# Patient Record
Sex: Female | Born: 1980 | Race: Black or African American | Hispanic: No | Smoking: Never smoker
Health system: Southern US, Community
[De-identification: ages and names within clinical notes are randomized; demographics above are authoritative.]

## PROBLEM LIST (undated history)

## (undated) DIAGNOSIS — F329 Major depressive disorder, single episode, unspecified: Secondary | ICD-10-CM

## (undated) DIAGNOSIS — O039 Complete or unspecified spontaneous abortion without complication: Secondary | ICD-10-CM

## (undated) DIAGNOSIS — N760 Acute vaginitis: Secondary | ICD-10-CM

## (undated) DIAGNOSIS — R9389 Abnormal findings on diagnostic imaging of other specified body structures: Secondary | ICD-10-CM

## (undated) DIAGNOSIS — Z8619 Personal history of other infectious and parasitic diseases: Secondary | ICD-10-CM

## (undated) DIAGNOSIS — D219 Benign neoplasm of connective and other soft tissue, unspecified: Secondary | ICD-10-CM

## (undated) DIAGNOSIS — F419 Anxiety disorder, unspecified: Secondary | ICD-10-CM

## (undated) DIAGNOSIS — B9689 Other specified bacterial agents as the cause of diseases classified elsewhere: Secondary | ICD-10-CM

## (undated) DIAGNOSIS — F32A Depression, unspecified: Secondary | ICD-10-CM

## (undated) DIAGNOSIS — T7840XA Allergy, unspecified, initial encounter: Secondary | ICD-10-CM

## (undated) DIAGNOSIS — R011 Cardiac murmur, unspecified: Secondary | ICD-10-CM

## (undated) DIAGNOSIS — B977 Papillomavirus as the cause of diseases classified elsewhere: Secondary | ICD-10-CM

## (undated) HISTORY — DX: Depression, unspecified: F32.A

## (undated) HISTORY — DX: Allergy, unspecified, initial encounter: T78.40XA

## (undated) HISTORY — DX: Abnormal findings on diagnostic imaging of other specified body structures: R93.89

## (undated) HISTORY — PX: OTHER SURGICAL HISTORY: SHX169

## (undated) HISTORY — DX: Anxiety disorder, unspecified: F41.9

## (undated) HISTORY — DX: Papillomavirus as the cause of diseases classified elsewhere: B97.7

## (undated) HISTORY — DX: Complete or unspecified spontaneous abortion without complication: O03.9

## (undated) HISTORY — DX: Personal history of other infectious and parasitic diseases: Z86.19

## (undated) HISTORY — DX: Major depressive disorder, single episode, unspecified: F32.9

## (undated) HISTORY — DX: Benign neoplasm of connective and other soft tissue, unspecified: D21.9

---

## 2000-09-13 ENCOUNTER — Emergency Department (HOSPITAL_COMMUNITY): Admission: EM | Admit: 2000-09-13 | Discharge: 2000-09-13 | Payer: Self-pay | Admitting: *Deleted

## 2001-04-07 ENCOUNTER — Emergency Department (HOSPITAL_COMMUNITY): Admission: EM | Admit: 2001-04-07 | Discharge: 2001-04-07 | Payer: Self-pay | Admitting: Emergency Medicine

## 2001-04-08 ENCOUNTER — Emergency Department (HOSPITAL_COMMUNITY): Admission: EM | Admit: 2001-04-08 | Discharge: 2001-04-08 | Payer: Self-pay | Admitting: Internal Medicine

## 2001-07-14 ENCOUNTER — Emergency Department (HOSPITAL_COMMUNITY): Admission: EM | Admit: 2001-07-14 | Discharge: 2001-07-15 | Payer: Self-pay | Admitting: Emergency Medicine

## 2001-09-27 ENCOUNTER — Emergency Department (HOSPITAL_COMMUNITY): Admission: EM | Admit: 2001-09-27 | Discharge: 2001-09-27 | Payer: Self-pay | Admitting: Internal Medicine

## 2001-11-07 ENCOUNTER — Emergency Department (HOSPITAL_COMMUNITY): Admission: EM | Admit: 2001-11-07 | Discharge: 2001-11-07 | Payer: Self-pay | Admitting: Emergency Medicine

## 2001-12-21 ENCOUNTER — Emergency Department (HOSPITAL_COMMUNITY): Admission: EM | Admit: 2001-12-21 | Discharge: 2001-12-21 | Payer: Self-pay | Admitting: Emergency Medicine

## 2002-09-22 ENCOUNTER — Emergency Department (HOSPITAL_COMMUNITY): Admission: EM | Admit: 2002-09-22 | Discharge: 2002-09-23 | Payer: Self-pay | Admitting: Emergency Medicine

## 2002-09-25 ENCOUNTER — Inpatient Hospital Stay (HOSPITAL_COMMUNITY): Admission: AD | Admit: 2002-09-25 | Discharge: 2002-09-25 | Payer: Self-pay | Admitting: Obstetrics and Gynecology

## 2002-09-26 ENCOUNTER — Encounter: Payer: Self-pay | Admitting: *Deleted

## 2002-09-26 ENCOUNTER — Inpatient Hospital Stay (HOSPITAL_COMMUNITY): Admission: AD | Admit: 2002-09-26 | Discharge: 2002-09-26 | Payer: Self-pay | Admitting: *Deleted

## 2002-09-29 ENCOUNTER — Observation Stay (HOSPITAL_COMMUNITY): Admission: AD | Admit: 2002-09-29 | Discharge: 2002-09-30 | Payer: Self-pay | Admitting: Family Medicine

## 2002-10-14 ENCOUNTER — Emergency Department (HOSPITAL_COMMUNITY): Admission: EM | Admit: 2002-10-14 | Discharge: 2002-10-14 | Payer: Self-pay | Admitting: Emergency Medicine

## 2002-10-19 ENCOUNTER — Inpatient Hospital Stay (HOSPITAL_COMMUNITY): Admission: AD | Admit: 2002-10-19 | Discharge: 2002-10-21 | Payer: Self-pay | Admitting: *Deleted

## 2002-11-16 ENCOUNTER — Encounter: Payer: Self-pay | Admitting: *Deleted

## 2002-11-16 ENCOUNTER — Observation Stay (HOSPITAL_COMMUNITY): Admission: AD | Admit: 2002-11-16 | Discharge: 2002-11-17 | Payer: Self-pay | Admitting: *Deleted

## 2002-12-16 ENCOUNTER — Emergency Department (HOSPITAL_COMMUNITY): Admission: EM | Admit: 2002-12-16 | Discharge: 2002-12-16 | Payer: Self-pay | Admitting: *Deleted

## 2002-12-21 ENCOUNTER — Ambulatory Visit (HOSPITAL_COMMUNITY): Admission: RE | Admit: 2002-12-21 | Discharge: 2002-12-21 | Payer: Self-pay | Admitting: *Deleted

## 2002-12-21 ENCOUNTER — Encounter: Payer: Self-pay | Admitting: *Deleted

## 2003-01-18 ENCOUNTER — Ambulatory Visit (HOSPITAL_COMMUNITY): Admission: RE | Admit: 2003-01-18 | Discharge: 2003-01-18 | Payer: Self-pay | Admitting: *Deleted

## 2003-01-18 ENCOUNTER — Encounter: Payer: Self-pay | Admitting: *Deleted

## 2003-02-19 ENCOUNTER — Ambulatory Visit (HOSPITAL_COMMUNITY): Admission: AD | Admit: 2003-02-19 | Discharge: 2003-02-19 | Payer: Self-pay | Admitting: *Deleted

## 2003-03-12 ENCOUNTER — Encounter: Payer: Self-pay | Admitting: *Deleted

## 2003-03-12 ENCOUNTER — Ambulatory Visit (HOSPITAL_COMMUNITY): Admission: RE | Admit: 2003-03-12 | Discharge: 2003-03-12 | Payer: Self-pay | Admitting: *Deleted

## 2003-03-29 ENCOUNTER — Ambulatory Visit (HOSPITAL_COMMUNITY): Admission: AD | Admit: 2003-03-29 | Discharge: 2003-03-29 | Payer: Self-pay | Admitting: *Deleted

## 2003-03-30 ENCOUNTER — Ambulatory Visit (HOSPITAL_COMMUNITY): Admission: AD | Admit: 2003-03-30 | Discharge: 2003-03-30 | Payer: Self-pay | Admitting: *Deleted

## 2003-04-03 ENCOUNTER — Ambulatory Visit (HOSPITAL_COMMUNITY): Admission: AD | Admit: 2003-04-03 | Discharge: 2003-04-03 | Payer: Self-pay | Admitting: *Deleted

## 2003-04-05 ENCOUNTER — Ambulatory Visit (HOSPITAL_COMMUNITY): Admission: AD | Admit: 2003-04-05 | Discharge: 2003-04-05 | Payer: Self-pay | Admitting: *Deleted

## 2003-04-09 ENCOUNTER — Ambulatory Visit (HOSPITAL_COMMUNITY): Admission: RE | Admit: 2003-04-09 | Discharge: 2003-04-09 | Payer: Self-pay | Admitting: *Deleted

## 2003-04-12 ENCOUNTER — Ambulatory Visit (HOSPITAL_COMMUNITY): Admission: RE | Admit: 2003-04-12 | Discharge: 2003-04-12 | Payer: Self-pay | Admitting: *Deleted

## 2003-04-12 ENCOUNTER — Inpatient Hospital Stay (HOSPITAL_COMMUNITY): Admission: AD | Admit: 2003-04-12 | Discharge: 2003-04-15 | Payer: Self-pay | Admitting: *Deleted

## 2003-07-31 ENCOUNTER — Emergency Department (HOSPITAL_COMMUNITY): Admission: EM | Admit: 2003-07-31 | Discharge: 2003-08-01 | Payer: Self-pay | Admitting: Emergency Medicine

## 2003-09-01 ENCOUNTER — Emergency Department (HOSPITAL_COMMUNITY): Admission: EM | Admit: 2003-09-01 | Discharge: 2003-09-01 | Payer: Self-pay | Admitting: Emergency Medicine

## 2003-09-02 ENCOUNTER — Emergency Department (HOSPITAL_COMMUNITY): Admission: EM | Admit: 2003-09-02 | Discharge: 2003-09-02 | Payer: Self-pay | Admitting: Emergency Medicine

## 2004-08-21 ENCOUNTER — Ambulatory Visit (HOSPITAL_COMMUNITY): Admission: RE | Admit: 2004-08-21 | Discharge: 2004-08-21 | Payer: Self-pay | Admitting: Family Medicine

## 2005-07-20 ENCOUNTER — Emergency Department (HOSPITAL_COMMUNITY): Admission: EM | Admit: 2005-07-20 | Discharge: 2005-07-20 | Payer: Self-pay | Admitting: Emergency Medicine

## 2005-10-12 ENCOUNTER — Emergency Department (HOSPITAL_COMMUNITY): Admission: EM | Admit: 2005-10-12 | Discharge: 2005-10-12 | Payer: Self-pay | Admitting: Specialist

## 2005-11-22 ENCOUNTER — Emergency Department (HOSPITAL_COMMUNITY): Admission: EM | Admit: 2005-11-22 | Discharge: 2005-11-22 | Payer: Self-pay | Admitting: Emergency Medicine

## 2005-11-26 ENCOUNTER — Ambulatory Visit (HOSPITAL_COMMUNITY): Admission: RE | Admit: 2005-11-26 | Discharge: 2005-11-26 | Payer: Self-pay | Admitting: Orthopaedic Surgery

## 2006-05-05 ENCOUNTER — Emergency Department (HOSPITAL_COMMUNITY): Admission: EM | Admit: 2006-05-05 | Discharge: 2006-05-05 | Payer: Self-pay | Admitting: Emergency Medicine

## 2006-10-28 ENCOUNTER — Emergency Department (HOSPITAL_COMMUNITY): Admission: EM | Admit: 2006-10-28 | Discharge: 2006-10-28 | Payer: Self-pay | Admitting: Emergency Medicine

## 2007-05-04 ENCOUNTER — Emergency Department (HOSPITAL_COMMUNITY): Admission: EM | Admit: 2007-05-04 | Discharge: 2007-05-04 | Payer: Self-pay | Admitting: Emergency Medicine

## 2007-06-24 ENCOUNTER — Emergency Department (HOSPITAL_COMMUNITY): Admission: EM | Admit: 2007-06-24 | Discharge: 2007-06-24 | Payer: Self-pay | Admitting: Emergency Medicine

## 2007-09-30 ENCOUNTER — Emergency Department (HOSPITAL_COMMUNITY): Admission: EM | Admit: 2007-09-30 | Discharge: 2007-10-01 | Payer: Self-pay | Admitting: Emergency Medicine

## 2007-10-01 ENCOUNTER — Emergency Department (HOSPITAL_COMMUNITY): Admission: EM | Admit: 2007-10-01 | Discharge: 2007-10-01 | Payer: Self-pay | Admitting: Emergency Medicine

## 2009-02-24 ENCOUNTER — Emergency Department (HOSPITAL_COMMUNITY): Admission: EM | Admit: 2009-02-24 | Discharge: 2009-02-24 | Payer: Self-pay | Admitting: Emergency Medicine

## 2009-05-01 ENCOUNTER — Emergency Department (HOSPITAL_COMMUNITY): Admission: EM | Admit: 2009-05-01 | Discharge: 2009-05-01 | Payer: Self-pay | Admitting: Emergency Medicine

## 2010-04-24 ENCOUNTER — Emergency Department (HOSPITAL_COMMUNITY): Admission: EM | Admit: 2010-04-24 | Discharge: 2010-04-24 | Payer: Self-pay | Admitting: Emergency Medicine

## 2010-06-08 ENCOUNTER — Emergency Department (HOSPITAL_COMMUNITY)
Admission: EM | Admit: 2010-06-08 | Discharge: 2010-06-08 | Payer: Self-pay | Source: Home / Self Care | Admitting: Emergency Medicine

## 2010-06-11 ENCOUNTER — Emergency Department (HOSPITAL_COMMUNITY)
Admission: EM | Admit: 2010-06-11 | Discharge: 2010-06-11 | Payer: Self-pay | Source: Home / Self Care | Admitting: Emergency Medicine

## 2010-06-13 ENCOUNTER — Emergency Department (HOSPITAL_COMMUNITY)
Admission: EM | Admit: 2010-06-13 | Discharge: 2010-06-13 | Payer: Self-pay | Source: Home / Self Care | Admitting: Emergency Medicine

## 2010-06-16 LAB — DIFFERENTIAL
Basophils Absolute: 0 10*3/uL (ref 0.0–0.1)
Basophils Absolute: 0 10*3/uL (ref 0.0–0.1)
Basophils Relative: 0 % (ref 0–1)
Basophils Relative: 0 % (ref 0–1)
Eosinophils Absolute: 0.2 10*3/uL (ref 0.0–0.7)
Eosinophils Absolute: 0 10*3/uL (ref 0.0–0.7)
Eosinophils Relative: 2 % (ref 0–5)
Eosinophils Relative: 0 % (ref 0–5)
Lymphocytes Relative: 29 % (ref 12–46)
Lymphocytes Relative: 16 % (ref 12–46)
Lymphs Abs: 2.5 10*3/uL (ref 0.7–4.0)
Lymphs Abs: 1.9 10*3/uL (ref 0.7–4.0)
Monocytes Absolute: 0.6 10*3/uL (ref 0.1–1.0)
Monocytes Absolute: 0.9 10*3/uL (ref 0.1–1.0)
Monocytes Relative: 8 % (ref 3–12)
Monocytes Relative: 8 % (ref 3–12)
Neutro Abs: 5.2 10*3/uL (ref 1.7–7.7)
Neutro Abs: 9.1 10*3/uL — ABNORMAL HIGH (ref 1.7–7.7)
Neutrophils Relative %: 61 % (ref 43–77)
Neutrophils Relative %: 76 % (ref 43–77)

## 2010-06-16 LAB — BASIC METABOLIC PANEL
BUN: 6 mg/dL (ref 6–23)
BUN: 5 mg/dL — ABNORMAL LOW (ref 6–23)
CO2: 25 mEq/L (ref 19–32)
CO2: 25 mEq/L (ref 19–32)
Calcium: 9.3 mg/dL (ref 8.4–10.5)
Calcium: 9.6 mg/dL (ref 8.4–10.5)
Chloride: 101 mEq/L (ref 96–112)
Chloride: 100 mEq/L (ref 96–112)
Creatinine, Ser: 0.66 mg/dL (ref 0.4–1.2)
Creatinine, Ser: 0.73 mg/dL (ref 0.4–1.2)
GFR calc Af Amer: >60 mL/min (ref >60)
GFR calc Af Amer: >60 mL/min (ref >60)
GFR calc non Af Amer: >60 mL/min (ref >60)
GFR calc non Af Amer: >60 mL/min (ref >60)
Glucose, Bld: 89 mg/dL (ref 70–99)
Glucose, Bld: 97 mg/dL (ref 70–99)
Potassium: 4.2 mEq/L (ref 3.5–5.1)
Potassium: 3.4 mEq/L — ABNORMAL LOW (ref 3.5–5.1)
Sodium: 132 mEq/L — ABNORMAL LOW (ref 135–145)
Sodium: 135 mEq/L (ref 135–145)

## 2010-06-16 LAB — URINALYSIS, ROUTINE W REFLEX MICROSCOPIC
Bilirubin Urine: NEGATIVE
Bilirubin Urine: NEGATIVE
Bilirubin Urine: NEGATIVE
Hgb urine dipstick: NEGATIVE
Hgb urine dipstick: NEGATIVE
Hgb urine dipstick: NEGATIVE
Ketones, ur: NEGATIVE mg/dL
Ketones, ur: 15 mg/dL — AB
Ketones, ur: 40 mg/dL — AB
Nitrite: NEGATIVE
Nitrite: NEGATIVE
Nitrite: NEGATIVE
Protein, ur: NEGATIVE mg/dL
Protein, ur: NEGATIVE mg/dL
Protein, ur: NEGATIVE mg/dL
Specific Gravity, Urine: 1.015 (ref 1.005–1.030)
Specific Gravity, Urine: 1.01 (ref 1.005–1.030)
Specific Gravity, Urine: 1.01 (ref 1.005–1.030)
Urine Glucose, Fasting: NEGATIVE mg/dL
Urine Glucose, Fasting: NEGATIVE mg/dL
Urine Glucose, Fasting: NEGATIVE mg/dL
Urobilinogen, UA: 0.2 mg/dL (ref 0.0–1.0)
Urobilinogen, UA: 0.2 mg/dL (ref 0.0–1.0)
Urobilinogen, UA: 0.2 mg/dL (ref 0.0–1.0)
pH: 7.5 (ref 5.0–8.0)
pH: 7 (ref 5.0–8.0)
pH: 7.5 (ref 5.0–8.0)

## 2010-06-16 LAB — CBC
HCT: 33.9 % — ABNORMAL LOW (ref 36.0–46.0)
HCT: 35.7 % — ABNORMAL LOW (ref 36.0–46.0)
Hemoglobin: 11.7 g/dL — ABNORMAL LOW (ref 12.0–15.0)
Hemoglobin: 12.2 g/dL (ref 12.0–15.0)
MCH: 29.9 pg (ref 26.0–34.0)
MCH: 29.7 pg (ref 26.0–34.0)
MCHC: 34.5 g/dL (ref 30.0–36.0)
MCHC: 34.2 g/dL (ref 30.0–36.0)
MCV: 86.7 fL (ref 78.0–100.0)
MCV: 86.9 fL (ref 78.0–100.0)
Platelets: 265 10*3/uL (ref 150–400)
Platelets: 309 10*3/uL (ref 150–400)
RBC: 3.91 MIL/uL (ref 3.87–5.11)
RBC: 4.11 MIL/uL (ref 3.87–5.11)
RDW: 12.1 % (ref 11.5–15.5)
RDW: 12.2 % (ref 11.5–15.5)
WBC: 8.5 10*3/uL (ref 4.0–10.5)
WBC: 11.9 10*3/uL — ABNORMAL HIGH (ref 4.0–10.5)

## 2010-06-16 LAB — COMPREHENSIVE METABOLIC PANEL
ALT: 40 U/L — ABNORMAL HIGH (ref 0–35)
AST: 21 U/L (ref 0–37)
Albumin: 4.1 g/dL (ref 3.5–5.2)
Alkaline Phosphatase: 47 U/L (ref 39–117)
BUN: 5 mg/dL — ABNORMAL LOW (ref 6–23)
CO2: 26 mEq/L (ref 19–32)
Calcium: 9.5 mg/dL (ref 8.4–10.5)
Chloride: 102 mEq/L (ref 96–112)
Creatinine, Ser: 0.76 mg/dL (ref 0.4–1.2)
GFR calc Af Amer: >60 mL/min (ref >60)
GFR calc non Af Amer: >60 mL/min (ref >60)
Glucose, Bld: 94 mg/dL (ref 70–99)
Potassium: 3.9 mEq/L (ref 3.5–5.1)
Sodium: 136 mEq/L (ref 135–145)
Total Bilirubin: 0.6 mg/dL (ref 0.3–1.2)
Total Protein: 7.8 g/dL (ref 6.0–8.3)

## 2010-06-16 LAB — PREGNANCY, URINE: Preg Test, Ur: POSITIVE

## 2010-06-16 LAB — LIPASE, BLOOD: Lipase: 17 U/L (ref 11–59)

## 2010-06-26 ENCOUNTER — Emergency Department (HOSPITAL_COMMUNITY)
Admission: EM | Admit: 2010-06-26 | Discharge: 2010-06-26 | Payer: Self-pay | Source: Home / Self Care | Admitting: Emergency Medicine

## 2010-06-29 ENCOUNTER — Emergency Department (HOSPITAL_COMMUNITY)
Admission: EM | Admit: 2010-06-29 | Discharge: 2010-06-29 | Payer: Self-pay | Source: Home / Self Care | Admitting: Emergency Medicine

## 2010-06-29 LAB — URINALYSIS, ROUTINE W REFLEX MICROSCOPIC
Bilirubin Urine: NEGATIVE
Nitrite: NEGATIVE
Urine Glucose, Fasting: NEGATIVE mg/dL
pH: 6.5 (ref 5.0–8.0)

## 2010-06-29 LAB — COMPREHENSIVE METABOLIC PANEL
ALT: 17 U/L (ref 0–35)
Alkaline Phosphatase: 49 U/L (ref 39–117)
CO2: 26 mEq/L (ref 19–32)
GFR calc non Af Amer: >60 mL/min (ref >60)
Glucose, Bld: 98 mg/dL (ref 70–99)
Potassium: 3.6 mEq/L (ref 3.5–5.1)
Sodium: 134 mEq/L — ABNORMAL LOW (ref 135–145)

## 2010-06-29 LAB — CBC
HCT: 35.5 % — ABNORMAL LOW (ref 36.0–46.0)
Hemoglobin: 12.3 g/dL (ref 12.0–15.0)
WBC: 13.1 10*3/uL — ABNORMAL HIGH (ref 4.0–10.5)

## 2010-06-29 LAB — POCT PREGNANCY, URINE: Preg Test, Ur: POSITIVE

## 2010-06-29 LAB — URINE MICROSCOPIC-ADD ON

## 2010-06-29 LAB — DIFFERENTIAL
Basophils Absolute: 0 10*3/uL (ref 0.0–0.1)
Lymphocytes Relative: 18 % (ref 12–46)
Neutro Abs: 9.7 10*3/uL — ABNORMAL HIGH (ref 1.7–7.7)

## 2010-07-01 ENCOUNTER — Emergency Department (HOSPITAL_COMMUNITY)
Admission: EM | Admit: 2010-07-01 | Discharge: 2010-07-01 | Payer: Self-pay | Source: Home / Self Care | Admitting: Emergency Medicine

## 2010-07-01 LAB — URINE CULTURE: Culture: NO GROWTH

## 2010-07-03 ENCOUNTER — Other Ambulatory Visit (HOSPITAL_COMMUNITY): Payer: Self-pay | Admitting: Family Medicine

## 2010-07-04 ENCOUNTER — Ambulatory Visit (HOSPITAL_COMMUNITY)
Admission: RE | Admit: 2010-07-04 | Discharge: 2010-07-04 | Disposition: A | Discharge status: Home / Self Care | Payer: Medicaid Other | Source: Ambulatory Visit | Attending: Family Medicine | Admitting: Family Medicine

## 2010-07-04 DIAGNOSIS — R110 Nausea: Secondary | ICD-10-CM | POA: Insufficient documentation

## 2010-07-04 DIAGNOSIS — R1310 Dysphagia, unspecified: Secondary | ICD-10-CM | POA: Insufficient documentation

## 2010-07-04 DIAGNOSIS — R634 Abnormal weight loss: Secondary | ICD-10-CM | POA: Insufficient documentation

## 2010-07-05 ENCOUNTER — Inpatient Hospital Stay (INDEPENDENT_AMBULATORY_CARE_PROVIDER_SITE_OTHER)
Admission: RE | Admit: 2010-07-05 | Discharge: 2010-07-05 | Disposition: A | Discharge status: Home / Self Care | Payer: Medicaid Other | Source: Ambulatory Visit | Attending: Family Medicine | Admitting: Family Medicine

## 2010-07-05 DIAGNOSIS — R112 Nausea with vomiting, unspecified: Secondary | ICD-10-CM

## 2010-07-05 LAB — HCG, QUANTITATIVE, PREGNANCY: hCG, Beta Chain, Quant, S: 32319 m[IU]/mL — ABNORMAL HIGH (ref <5)

## 2010-07-07 ENCOUNTER — Other Ambulatory Visit (HOSPITAL_COMMUNITY): Payer: Self-pay | Admitting: Family Medicine

## 2010-07-07 DIAGNOSIS — R102 Pelvic and perineal pain: Secondary | ICD-10-CM

## 2010-07-08 ENCOUNTER — Ambulatory Visit (HOSPITAL_COMMUNITY)
Admission: RE | Admit: 2010-07-08 | Discharge: 2010-07-08 | Disposition: A | Discharge status: Home / Self Care | Payer: Medicaid Other | Source: Ambulatory Visit | Attending: Family Medicine | Admitting: Family Medicine

## 2010-07-08 DIAGNOSIS — N83209 Unspecified ovarian cyst, unspecified side: Secondary | ICD-10-CM | POA: Insufficient documentation

## 2010-07-08 DIAGNOSIS — R102 Pelvic and perineal pain: Secondary | ICD-10-CM

## 2010-07-08 DIAGNOSIS — R9389 Abnormal findings on diagnostic imaging of other specified body structures: Secondary | ICD-10-CM | POA: Insufficient documentation

## 2010-07-08 DIAGNOSIS — Z3200 Encounter for pregnancy test, result unknown: Secondary | ICD-10-CM | POA: Insufficient documentation

## 2010-07-21 ENCOUNTER — Other Ambulatory Visit: Payer: Self-pay | Admitting: Obstetrics & Gynecology

## 2010-07-21 ENCOUNTER — Encounter (HOSPITAL_COMMUNITY): Payer: Medicaid Other | Attending: Obstetrics & Gynecology

## 2010-07-21 DIAGNOSIS — Z01812 Encounter for preprocedural laboratory examination: Secondary | ICD-10-CM | POA: Insufficient documentation

## 2010-07-21 LAB — COMPREHENSIVE METABOLIC PANEL
ALT: 11 U/L (ref 0–35)
AST: 15 U/L (ref 0–37)
Albumin: 3.6 g/dL (ref 3.5–5.2)
Alkaline Phosphatase: 44 U/L (ref 39–117)
Glucose, Bld: 99 mg/dL (ref 70–99)
Potassium: 3.8 mEq/L (ref 3.5–5.1)
Sodium: 137 mEq/L (ref 135–145)
Total Protein: 6.3 g/dL (ref 6.0–8.3)

## 2010-07-21 LAB — TYPE AND SCREEN
ABO/RH(D): B POS
Antibody Screen: NEGATIVE

## 2010-07-21 LAB — CBC
HCT: 32.1 % — ABNORMAL LOW (ref 36.0–46.0)
Hemoglobin: 10.7 g/dL — ABNORMAL LOW (ref 12.0–15.0)
RDW: 12.7 % (ref 11.5–15.5)
WBC: 6.4 10*3/uL (ref 4.0–10.5)

## 2010-07-21 LAB — URINALYSIS, ROUTINE W REFLEX MICROSCOPIC
Bilirubin Urine: NEGATIVE
Ketones, ur: NEGATIVE mg/dL
Urine Glucose, Fasting: NEGATIVE mg/dL

## 2010-07-21 LAB — SURGICAL PCR SCREEN
MRSA, PCR: NEGATIVE
Staphylococcus aureus: NEGATIVE

## 2010-07-23 ENCOUNTER — Ambulatory Visit (HOSPITAL_COMMUNITY)
Admission: RE | Admit: 2010-07-23 | Discharge: 2010-07-23 | Disposition: A | Discharge status: Home / Self Care | Payer: Medicaid Other | Source: Ambulatory Visit | Attending: Obstetrics & Gynecology | Admitting: Obstetrics & Gynecology

## 2010-07-23 ENCOUNTER — Other Ambulatory Visit: Payer: Self-pay | Admitting: Obstetrics & Gynecology

## 2010-07-23 DIAGNOSIS — O0289 Other abnormal products of conception: Secondary | ICD-10-CM | POA: Insufficient documentation

## 2010-08-14 NOTE — Op Note (Signed)
NAME:  Cynthia Molina, Cynthia Molina              ACCOUNT NO.:  [id number]  MEDICAL RECORD NO.:  [id number]           PATIENT TYPE:  O  LOCATION:  DAYP                          FACILITY:  [facility]  PHYSICIAN:  Lazaro Arms, M.D.   DATE OF BIRTH:  17-Apr-1981  DATE OF PROCEDURE: DATE OF DISCHARGE:  07/23/2010                              OPERATIVE REPORT   PREOPERATIVE DIAGNOSES: 1. Persistent elevated quantitative HCG with intrauterine content,     status post termination attempt x2. 2. 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  POSTOPERATIVE DIAGNOSES: 1. Persistent elevated quantitative HCG with intrauterine content,     status post termination attempt x2. 2. 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  PROCEDURE:  Hysteroscopy with suction and sharp uterine curettage.  SURGEON:  Lazaro Arms, M.D.  ANESTHESIA:  General endotracheal.  FINDINGS:  The patient had an unwanted pregnancy which she attempted to terminate on January 18; however, she remained with symptoms of pregnancy.  She sought attention of her local medical doctor in [place], and at that point, had an ultrasound which showed products of conception still remaining in the uterus.  Her quantitative HCG was 32,000 at that time.  She went back to the clinic which did her original termination, and they did another D and C on the patient, that was on February 8.  However, her symptoms of pregnancy persisted including nausea and vomiting and ptyalism, and she presented and saw me last week, at which time I did another ultrasound which revealed contents in the uterus and a quantitative HCG of 15,000 which I thought was most consistent with a probable molar pregnancy.  As a result, she was admitted today for hysteroscopic evaluation and D and C for pathological confirmation.  She called the clinic where she had the termination attempts, and there was no pathology sent on either after either procedure.  Today, there was a minimal amount of tissue in the  anterior wall of the uterus.  The uterus otherwise appeared to be pretty smooth. There was no evidence of myometrial invasion with the tissue, and it was all removed relatively easily.  DESCRIPTION OF OPERATION:  The patient was taken to the operating room, placed in supine position, underwent general endotracheal anesthesia, placed in dorsal supine position, and prepped and draped in usual sterile fashion.  Her cervix was grasped and dilated serially to allow passage of the hysteroscope.  A diagnostic hysteroscopy was performed with the above-noted findings seen.  Suction curettage was then performed with a #12 curved suction curette.  Multiple passes were made, and tissue was removed.  Good uterine cry was obtained in all areas, this was confirmed with the sharp curette.  Methergine 0.2 IM was given for myometrial contraction, and she was observed and the bleeding significantly diminished along with manual uterine massage.  All the tissue was felt to be removed.  The procedure was completed.  The patient was awakened from anesthesia, taken to recovery room in good stable condition.  She received Ancef and Toradol prophylactically and will be seen back in the office next week for followup.     Lazaro Arms, M.D.     Loraine Maple  D:  07/23/2010  T:  07/24/2010  Job:  [id number]  Electronically Signed by Duane Lope M.D. on 08/14/2010 07:43:15 AM

## 2010-09-02 LAB — URINE MICROSCOPIC-ADD ON

## 2010-09-02 LAB — WET PREP, GENITAL: Clue Cells Wet Prep HPF POC: NONE SEEN

## 2010-09-02 LAB — URINALYSIS, ROUTINE W REFLEX MICROSCOPIC
Glucose, UA: NEGATIVE mg/dL
Hgb urine dipstick: NEGATIVE
Ketones, ur: NEGATIVE mg/dL
Protein, ur: NEGATIVE mg/dL
pH: 6.5 (ref 5.0–8.0)

## 2010-09-02 LAB — POCT PREGNANCY, URINE: Preg Test, Ur: NEGATIVE

## 2010-09-05 LAB — GC/CHLAMYDIA PROBE AMP, GENITAL
Chlamydia, DNA Probe: NEGATIVE
GC Probe Amp, Genital: NEGATIVE

## 2010-09-05 LAB — URINALYSIS, ROUTINE W REFLEX MICROSCOPIC
Nitrite: NEGATIVE
Specific Gravity, Urine: 1.031 — ABNORMAL HIGH (ref 1.005–1.030)
Urobilinogen, UA: 4 mg/dL — ABNORMAL HIGH (ref 0.0–1.0)
pH: 6.5 (ref 5.0–8.0)

## 2010-09-05 LAB — URINE MICROSCOPIC-ADD ON

## 2010-09-05 LAB — WET PREP, GENITAL: Yeast Wet Prep HPF POC: NONE SEEN

## 2010-10-17 NOTE — Discharge Summary (Signed)
NAME:  Cynthia Molina, Cynthia Molina                        ACCOUNT NO.:  [id number]   MEDICAL RECORD NO.:  [id number]                   PATIENT TYPE:  OIB   LOCATION:  A415                                 FACILITY:  [facility]   PHYSICIAN:  Langley Gauss, M.D.                DATE OF BIRTH:  02/04/1981   DATE OF ADMISSION:  04/12/2003  DATE OF DISCHARGE:  04/12/2003                                 DISCHARGE SUMMARY   A 30 year old, gravida 1, para 0, 34-3/[redacted] weeks gestation presents to the  [facility] complaining of uterine contractions, lower abdominal  pain since 22:00 the p.m. prior. The patient's prenatal course was otherwise  uncomplicated.   PAST MEDICAL HISTORY:  Noncontributory.   PHYSICAL EXAMINATION:  GENERAL: In no acute distress.  VITAL SIGNS: 97.5, 87, 20, 102/59. External fetal monitor, fetal heart rate  130 to 140, average to long-term variability. ABDOMEN: Only one palpable  uterine contraction is identified and none are traced on external fetal  monitor.  Cervix, per the nursing staff, noted to be fingertip, very thick,  and posterior.  Head is -1 station with the presenting part nonpalpable.   HOSPITAL COURSE:  The patient continued to complications of some pelvic-type  pressure. Thus, she was treated with p.o. Ambien for therapeutic rest.  Observation was continued. The patient was noted to have no renewed  complaints of contraction-type activity nor were there any contractions  noted on external fetal monitor. Fetal heart rate remained reassuring. Thus,  the patient was discharged to home on April 12, 2003, at 03:20.     ___________________________________________                                         Langley Gauss, M.D.   DC/MEDQ  D:  07/02/2003  T:  07/02/2003  Job:  [id number]

## 2010-10-17 NOTE — Discharge Summary (Signed)
NAME:  Cynthia Molina, Cynthia Molina                        ACCOUNT NO.:  [id number]   MEDICAL RECORD NO.:  [id number]                   PATIENT TYPE:  INP   LOCATION:  A417                                 FACILITY:  [facility]   PHYSICIAN:  Langley Gauss, M.D.                DATE OF BIRTH:  [birth year]   DATE OF ADMISSION:  04/12/2003  DATE OF DISCHARGE:  04/15/2003                                 DISCHARGE SUMMARY   DIAGNOSES:  A 34-5/7 weeks intrauterine pregnancy, delivered via a primary  low-transverse cesarean section, delivered a 4 pound 13 ounce female infant.   DISPOSITION:  At the time of discharge patient does have staples in place,  Pfannenstiel incision.  Follow up in the office in 3-4 days time for staple  removal.  She is continued on hotel/motel status, on April 15, 2003, due  to low birth weight and need for continued fetal assessment.   DISCHARGE MEDICATIONS:  1. Tylox, #30, with no refill.  2. The patient is to continue her prenatal vitamins for supplemental iron.   She is given a copy of standarized discharge instructions.   LABORATORY DATA:  Admission hemoglobin/hematocrit 10.0/30.0 with a white  count of 9.9.  On postoperative day #1, hemoglobin 9.6, hematocrit 28.3 with  a white count of 12.8.  B positive blood type.  Negative antibody screen.   HOSPITAL COURSE:  See previous dictations.  The patient was seen in the  office on April 12, 2003, determined to be in active preterm labor,  primary low-transverse cesarean section performed the p.m. of April 12, 2003.  Postoperatively the patient did well.  A Foley catheter documents  excellent urine output, thus it was discontinued on April 13, 2003.  Patient's vital signs remained stable.  With infiltration of the IV, it  likewise too was discontinued and patient did well with p.o. pain  medication.  The patient remained afebrile.  She did increase her ambulation  on April 13, 2003.   On April 14, 2003, she  is again noted to be afebrile.  She is advanced to  a regular general diet.  Abdomen is soft.  JP drain is aspirated and  removed.  The incision is noted to be well approximated, minimal erythema,  minimal induration noted.  The patient thus stable for a conversion to a  __________ hotel status on April 15, 2003 with the baby continuing to be  hospitalized.  The infant's hospital course initially the patient's baby did  require treatment under the Oxyhood, did very well with this and was easily  weaned on postoperative day #1 and has subsequently done well.     ___________________________________________                                         Langley Gauss, M.D.  DC/MEDQ  D:  04/14/2003  T:  04/14/2003  Job:  [id number]

## 2010-10-17 NOTE — Discharge Summary (Signed)
NAME:  Cynthia Molina, Cynthia Molina                        ACCOUNT NO.:  [id number]   MEDICAL RECORD NO.:  [id number]                   PATIENT TYPE:  OBV   LOCATION:  A422                                 FACILITY:  [facility]   PHYSICIAN:  Langley Gauss, M.D.                DATE OF BIRTH:  09/03/1980   DATE OF ADMISSION:  11/16/2002  DATE OF DISCHARGE:  11/17/2002                                 DISCHARGE SUMMARY   DIAGNOSES:  Thirteen-week intrauterine pregnancy with hyperemesis as  exhibited by presence of ketonuria as well as weight loss.  No other  electrolyte disturbances were identified.   LABORATORY DATA:  Pertinent laboratory studies revealed the patient has  previously had prenatal profile 1 done in the office which was essentially  within normal limits.  This hospitalization, hemoglobin 10.9, hematocrit  31.6, with a white count of 7.6.  Amylase minimally elevated at 154.  Liver  function tests within normal limits.  Bilirubin within normal limits.  Urinalysis pertinent for positive ketones and a small amount of bilirubin.  Negative nitrites, negative esterase.   RADIOLOGICAL STUDIES:  An ultrasound of the abdomen had revealed multiple  small gallstones within the gallbladder consistent with cholelithiasis, no  gallbladder wall thickening or fluid was identified.  Common duct was felt  to be within normal limits.  Fetal heart tones were documented during this  ultrasound.   HOSPITAL COURSE:  The patient was seen in the office on November 16, 2002, with  significant complaints of nausea and vomiting and continuing to have excess  salivation with spitting into a cup with the presence of ketonuria, the  weight loss, and the inability to tolerate essentially any p.o. intake, the  patient was admitted for observation and hospitalization.  She was treated  with IV fluids, given IM Phenergan for nausea effect.  She was continued on  the IV fluids to which she responded very well with increased  activity.  She  also had increased appetite such that on the day of discharge, the patient  was tolerating a regular general diet with adequate p.o. intake.  The  gallbladder ultrasonographic findings were discussed with the patient.  At  [redacted] weeks gestation, she may very well just still be experiencing nausea and  vomiting as a result of the pregnancy, but these gallstones will be followed  carefully such that if the nausea and vomiting persists further into the  pregnancy, we may possibly entertain these as a contributing cause.  She was  advised to follow up in our office in one week's time at which time we would  evaluate this and see whether we needed to refer her to a general surgeon.  The patient is thus discharged to home on November 17, 2002.  Langley Gauss, M.D.    DC/MEDQ  D:  12/13/2002  T:  12/13/2002  Job:  [id number]

## 2010-10-17 NOTE — Op Note (Signed)
NAME:  EMERY, BINZ                        ACCOUNT NO.:  [id number]   MEDICAL RECORD NO.:  [id number]                   PATIENT TYPE:  INP   LOCATION:  A417                                 FACILITY:  [facility]   PHYSICIAN:  Langley Gauss, M.D.                DATE OF BIRTH:  Mar 12, 1981   DATE OF PROCEDURE:  04/12/2003  DATE OF DISCHARGE:                                 OPERATIVE REPORT   PREOPERATIVE DIAGNOSES:  1. A 34 5/7 weeks intrauterine pregnancy.  2. Preterm labor with premature cervical dilatation.  3. Footling breech presentation.   POSTOPERATIVE DIAGNOSES:  1. A 34 5/7 weeks intrauterine pregnancy.  2. Preterm labor with premature cervical dilatation.  3. Footling breech presentation.   PROCEDURE:  Primary low transverse cesarean section.   SURGEON:  Langley Gauss, M.D.   ESTIMATED BLOOD LOSS:  600 mL   ANALGESIA:  The patient received placement of spinal from CRNA staff.   DRAINS:  Foley catheter to straight drainage. A JP catheter is also placed  in the subcutaneous space.   FINDINGS:  A 4 pound 13 ounce female infant surrounded by a small amount of  clear amniotic fluid. No ruptured membranes prior to the procedure.  Delivered in a double-footling breech presentation.   DESCRIPTION OF PROCEDURE:  The patient had been referred from the office in  late stages of active preterm labor with cervix dilated 4 cm, double-  footling breech presentation confirmed. The patient did go to the fourth  floor where preparations were made for delivery by cesarean section. Vital  signs were stable. The patient had a spinal analgesic administered by the  anesthesia staff without difficulty was then returned to the operating room  table with a slight left lateral tilt. After ensuring adequate surgical  analgesia, a sharp knife was used to incise a Pfannenstiel incision through  the skin dissecting down to the fascial plane utilizing sharp knife and  cauterizing bleeders along  the way. The fracture is then incised in  transverse curvilinear manner while sharply dissecting off the underlying  rectus muscle. The edges of the fascia were then grasped with straight  Kocher clamps and dissected off the underlying rectus muscle in the midline  both superiorly and inferiorly in the avascular plane. The rectus muscle was  then bluntly separated, peritoneal cavity was atraumatically bluntly entered  to the inferior most portion of the incision. The peritoneal incision  extended superiorly and inferiorly, bladder was identified. A bladder blade  is then placed, lower uterine segment is identified and a bladder flap is  created from the vesicouterine fold utilizing sharp dissection. The bladder  flap was bluntly separated off the inferior lower uterine segment, a sharp  knife was then used to score a low transverse uterine incision. An intact  amniotic sac was encountered in the midline, my index finger was used to  bluntly extend the uterine incision bilaterally. Artificial  rupture of  membranes is then performed with findings of clear amniotic fluid. My right  hand then reached into the uterine cavity where it was noted to be in a back  up position in a double-footling breach presentation. The two feet were  identified and they were delivered through the uterine incision without  difficulty while flexing at the hip.  Gentle traction was placed on the  infant's hip combined with fundal pressure which results in delivery to the  level of the infant's neck. The arms are then swept anteriorly across the  chest. The head is then flexed utilizing Mauriceau maneuver. This combined  with gentle traction and fundal pressure actually results in delivery of the  head  through the uterine incision without difficulty. The mouth and nares  were bulb suctioned of clear amniotic fluid. A spontaneous vigorous  breathing cry was noted. Umbilical cord is milked towards the infant. The  cord  is doubly clamped and cut and infant is taken to the pediatrician, Dr.  Vivia Ewing. Arterial cord gas and cord blood are then obtained. Gentle  traction on the umbilical cord results in separation which upon examination  was noted to be a small though intact placenta with associated three-vessel  cord. Intrauterine x-ray showed __________  fragments, excellent uterine  tone is achieved. The uterine incision is closed in two layers of #1 chromic  in a running locked fashion, second layer being an imbricating layer results  in excellent hemostasis.  Tubes and ovaries noted normal in appearance. The  cul-de-sac is free of all clots, uterus returned to the pelvic cavity.  Sponge, needle and instrument counts correct x2.  Irrigation is then  performed in the pelvic cavity removing all clots. The uterus is returned,  peritoneum grasped using Kelly clamps. The peritoneum as well as the  underlying rectus muscle are closed in a single layer of continuous running  #0 Chromic suture. No subfascial bleeders were identified. The fascia is  then closed with a continuous running #1 PDS suture. The subcutaneous space  is again examined. Several small minimally active bleeders are cauterized. A  JP drain is then placed in the subcutaneous space with a separate exit wound  to the left apex of the incision. The JP drain is sutured into place.  Betadine solution is used to cleanse the subcutaneous tissue. Three  horizontal mattress sutures of #1 PDS suture are then placed into the skin  edges which allows eversion and closure of the skin utilizing skin staples.  A total of 30 mL of 0.5% bupivacaine plain was injected along the incision.  The patient is then taken to the recovery room in stable condition at which  time being monitored for return of motor function. Operative findings  discussed with the patient's waiting family. The infant was taken to the nursery at which time some mild retracting and grunting  were noted. The  infant was then placed under the Oxy-Hood. Most recently the infant is noted  to be stable under the Oxy-Hood with stabilization of condition.      ___________________________________________                                            Langley Gauss, M.D.   DC/MEDQ  D:  04/13/2003  T:  04/13/2003  Job:  [id number]

## 2010-10-17 NOTE — H&P (Signed)
   NAME:  Cynthia Molina, Cynthia Molina                        ACCOUNT NO.:  [id number]   MEDICAL RECORD NO.:  [id number]                   PATIENT TYPE:  INP   LOCATION:  A423                                 FACILITY:  [facility]   PHYSICIAN:  Langley Gauss, M.D.                DATE OF BIRTH:  Apr 22, 1981   DATE OF ADMISSION:  10/19/2002  DATE OF DISCHARGE:                                HISTORY & PHYSICAL   HISTORY OF PRESENT ILLNESS:  The patient is a 30 year old at [redacted] weeks  gestation.  She is admitted directly from the office with findings of a 9-  pound weight loss over the prior 2 weeks as well as moderate ketones.  The  patient presented for a routine visit, stating that she had intermittent  nausea and vomiting, but over the last 5 days this has been nearly  consistent.  She did not contact anyone prior to today's routine scheduled  visit.  The patient also has some reflux-type symptoms.  She denies any  fatty food intolerance stating that everything she eats makes her feel  nauseated.  She eats a bland diet as well as liquids, and has difficulty  even keeping ginger ale down.  Thus, she is referred to the hospital for a  direct admission.  The patient's prenatal course otherwise uncomplicated.  She did have an ultrasound performed on 10/19/2002 which documented a viable  intrauterine pregnancy at [redacted] weeks gestation with fetal cardiac activity  identified.  On examination she is noted to be afebrile.  Abdomen soft and  nontender. Moderate ketones noted in the office.   PHYSICAL EXAMINATION:  GENERAL:  The patient minimally verbal.  VITAL SIGNS:  98.2, blood pressure is 108/61; pulse is 94; respiratory rate  is 18.  HEENT:  Mucous membranes slightly moist.  ABDOMEN:  Soft, nontender.  PELVIC:  Normal external genitalia.   LABORATORY DATA:  On Transabdominal ultrasound a viable, vigorous  intrauterine pregnancy with fetal cardiac activity identified with findings  consistent with a [redacted] weeks  gestation.  Laboratory studies:  As stated  previously 2+ ketonuria on dip stick.  Hemoglobin 12.6, hematocrit 35.8 with  a white count of 9.4, metabolic disturbance as exhibited by moderate  ketonuria.  In addition, sodium low at 131, potassium low at 3.2.   ASSESSMENT:  A 9-week intrauterine pregnancy with hyperemesis gravidarum  exhibited by metabolic disturbance with ketonuria and abnormal electrolytes.  The patient is admitted for IV fluids as well as antiemetic therapy.                                               Langley Gauss, M.D.    DC/MEDQ  D:  10/21/2002  T:  10/21/2002  Job:  [id number]

## 2010-10-17 NOTE — H&P (Signed)
NAME:  Cynthia Molina, Cynthia Molina                        ACCOUNT NO.:  [id number]   MEDICAL RECORD NO.:  [id number]                   PATIENT TYPE:  INP   LOCATION:  A417                                 FACILITY:  [facility]   PHYSICIAN:  Langley Gauss, M.D.                DATE OF BIRTH:  25-Aug-1980   DATE OF ADMISSION:  04/12/2003  DATE OF DISCHARGE:                                HISTORY & PHYSICAL   REASON FOR ADMISSION:  This is a 30 year old, gravida 1, para 0, 34-5/[redacted]  weeks gestation who was evaluated in the office complaining of irregular  uterine contractions.  The patient was seen early a.m. previously  complaining of irregular uterine contractions.  No contractions were noted  on external fetal monitor.  The patient received therapeutic rest at that  time.  She states she slept well and awoke this a.m.  No leakage of fluid,  no vaginal bleeding, but she did notice increased primarily pelvic pressure  and some renewed tightening.   In the office, the patient is examined and noted to be 4 cm dilated with a  footling breech presentation identified.  The patient's prenatal course  complicated by decreased amniotic fluid on serial ultrasounds with an AFI  typically around 7.  The patient has been receiving twice weekly NSTs which  have all been reactive.  The most recent ultrasound did, however, reveal  good fetal growth with fetal growth at the 50th percentile.  The patient is  noted to be a B positive blood type, AFP within normal limits.  The patient  is noted a past medical history of a positive Chlamydia in 2002.  She  subsequently had test of cure which was negative and most recently March 12, 2003 GC and Chlamydia cultures negative.  She currently is on prenatal  vitamins.  Last p.m. she did receive Ambien, Lortab, and a single Keflex.   ALLERGIES:  She has no known drug allergies.   PHYSICAL EXAMINATION:  VITAL SIGNS:  Height 5 feet 4.5 inches, 145 pounds  prepregnancy, 146  today, blood pressure 114/60, pulse 80, respiratory rate  20.  HEENT:  Negative.  No adenopathy.  Mucous membranes moist.  NECK:  Supple.  Thyroid is nonpalpable.  LUNGS:  Clear.  CARDIOVASCULAR:  Regular rate and rhythm.  ABDOMEN:  Soft, nontender.  Fundal height is 33 cm.  A vertex presentation  is not present, rather the head is in the left upper quadrant and by  Leopold's maneuver is noted to be a breech presentation.  Fetal heart tones  are noted to be auscultated at 150.  PELVIC:  Normal external genitalia.  No leakage of fluid, no vaginal  bleeding.  No vaginitis identified.  Digital examination performed in the  office does reveal cervix 4 cm dilated, -2 station, 80% effaced, footling  breech presentation confirmed by palpation of a fetal foot through intact  amniotic membranes.  ASSESSMENT/PLAN:  This is a 34-5/7 weeks intrauterine pregnancy with  premature dilatation and preterm labor with a footling breech presentation.  The risks of continuing the pregnancy at this time with potential for  rupture of membranes and a prolapsed cord are far greater than any risks  associated with preterm delivery at 34-5/[redacted] weeks  gestation.  Thus, no tocolytics are administered and preparations will be  made to proceed with the cesarean section delivery this evening.  Risks and  benefits of the surgical procedure are discussed with the patient and she  elects to proceed.     ___________________________________________                                         Langley Gauss, M.D.   DC/MEDQ  D:  04/14/2003  T:  04/14/2003  Job:  [id number]

## 2010-10-17 NOTE — Discharge Summary (Signed)
   NAME:  RAIZY, AUZENNE                        ACCOUNT NO.:  [id number]   MEDICAL RECORD NO.:  [id number]                   PATIENT TYPE:  INP   LOCATION:  A423                                 FACILITY:  [facility]   PHYSICIAN:  Langley Gauss, M.D.                DATE OF BIRTH:  07-10-1980   DATE OF ADMISSION:  10/19/2002  DATE OF DISCHARGE:                                 DISCHARGE SUMMARY   DIAGNOSES:  1. A 9-week intrauterine pregnancy.  2. Hyperemesis.   DISPOSITION:  Follow up in the office in 2 weeks time.  At the time of  discharge the patient is tolerating a regular general diet.  She has  received IM Phenergan during the hospital stay as well as IV Pepcid.  Fluids  have continued at 150 cc/h.  At the time of discharge the patient is  markedly improved with minimal nausea.  She has excellent p.o. intake  according to the nursing staff, increasing diet on 10/20/2002 such that she  had grilled cheese sandwich, soup broth, and was able to very easily  tolerate p.o. intake; such that she is discharged home on 10/21/2002.   COURSE IN HOSPITAL:  The patient was admitted on 10/19/2002.  She received  IV fluids as well as IM Phenergan.  The patient slept well throughout the  first evening as she had had difficulty sleeping.  Hospitalization,  therefore, consisted primarily of correction of the electrolytes with  intravenous fluids and antiemetic therapy.  The patient will be followed  closely in the office for return of the weight that she has lost and  continued weight gain during the pregnancy.                                               Langley Gauss, M.D.    DC/MEDQ  D:  10/21/2002  T:  10/21/2002  Job:  [id number]

## 2011-03-09 LAB — D-DIMER, QUANTITATIVE: D-Dimer, Quant: 0.31

## 2011-03-09 LAB — CBC
HCT: 38.6
MCHC: 33
MCV: 87.1
Platelets: 296
RDW: 12.8

## 2011-03-09 LAB — DIFFERENTIAL
Basophils Absolute: 0
Basophils Relative: 0
Eosinophils Absolute: 0 — ABNORMAL LOW
Eosinophils Relative: 0
Lymphs Abs: 1.9

## 2012-05-23 ENCOUNTER — Emergency Department (HOSPITAL_COMMUNITY)
Admission: EM | Admit: 2012-05-23 | Discharge: 2012-05-24 | Disposition: A | Discharge status: Home / Self Care | Payer: Medicaid Other | Attending: Emergency Medicine | Admitting: Emergency Medicine

## 2012-05-23 ENCOUNTER — Encounter (HOSPITAL_COMMUNITY): Payer: Self-pay | Admitting: *Deleted

## 2012-05-23 DIAGNOSIS — R350 Frequency of micturition: Secondary | ICD-10-CM | POA: Insufficient documentation

## 2012-05-23 DIAGNOSIS — N390 Urinary tract infection, site not specified: Secondary | ICD-10-CM

## 2012-05-23 DIAGNOSIS — R011 Cardiac murmur, unspecified: Secondary | ICD-10-CM | POA: Insufficient documentation

## 2012-05-23 DIAGNOSIS — Z3202 Encounter for pregnancy test, result negative: Secondary | ICD-10-CM | POA: Insufficient documentation

## 2012-05-23 HISTORY — DX: Cardiac murmur, unspecified: R01.1

## 2012-05-23 LAB — WET PREP, GENITAL
Clue Cells Wet Prep HPF POC: NONE SEEN
Trich, Wet Prep: NONE SEEN

## 2012-05-23 LAB — URINALYSIS, ROUTINE W REFLEX MICROSCOPIC
Bilirubin Urine: NEGATIVE
Ketones, ur: NEGATIVE mg/dL
Nitrite: NEGATIVE
Protein, ur: NEGATIVE mg/dL
pH: 5.5 (ref 5.0–8.0)

## 2012-05-23 LAB — URINE MICROSCOPIC-ADD ON

## 2012-05-23 NOTE — ED Notes (Signed)
Pelvic cart at bedside, set up as requested by provider.

## 2012-05-23 NOTE — ED Provider Notes (Signed)
History    This chart was scribed for Cynthia Seamen, MD, MD by Smitty Pluck, ED Scribe. The patient was seen in room APA02 and the patient's care was started at 10:57 PM.   CSN: [id number]  Arrival date & time 05/23/12  2028      Chief Complaint  Patient presents with  . Dysuria    (Consider location/radiation/quality/duration/timing/severity/associated sxs/prior treatment) HPI Cynthia Molina is a 31 y.o. female who presents to the Emergency Department complaining of constant, moderate dysuria onset 2 weeks ago. Pt reports having urinary frequency, foul odor to urine and white vaginal discharge. Symptoms are moderate. Pt has taken AZO without relief. She reports having vaginal itching. She denies fever, chills.nausea, abdominal pain, vomiting and any other pain. Pt reports that she has skin lesion to right buttock that itches.    Past Medical History  Diagnosis Date  . Heart murmur     Past Surgical History  Procedure Date  . Cesarean section     History reviewed. No pertinent family history.  History  Substance Use Topics  . Smoking status: Never Smoker   . Smokeless tobacco: Not on file  . Alcohol Use: No    OB History    Grav Para Term Preterm Abortions TAB SAB Ect Mult Living                  Review of Systems 10 Systems reviewed and all are negative for acute change except as noted in the HPI.   Allergies  Compazine  Home Medications   Current Outpatient Rx  Name  Route  Sig  Dispense  Refill  . PHENAZOPYRIDINE HCL 95 MG PO TABS   Oral   Take 95 mg by mouth once as needed. For relief           BP 119/76  Pulse 56  Resp 20  SpO2 100%  LMP 05/17/2012  Physical Exam  Nursing note and vitals reviewed. Constitutional: She appears well-developed and well-nourished. No distress.  HENT:  Head: Normocephalic and atraumatic.  Eyes: Conjunctivae normal and EOM are normal. Pupils are equal, round, and reactive to light.  Neck: Normal range of  motion. Neck supple.  Cardiovascular: Normal rate, regular rhythm and normal heart sounds.   No murmur heard. Pulmonary/Chest: Effort normal and breath sounds normal. No respiratory distress. She has no wheezes. She has no rales.  Abdominal: Soft. She exhibits no distension. There is no tenderness. There is no rebound, no guarding and no CVA tenderness.  Skin:       There is a raised plaque right of superior aspect of natal cleft that appears to be scratched.   GU: normal external genitalia; blood tinged mucoid discharge; no cervical motion tenderness; no adnexal tenderness  ED Course  Procedures (including critical care time)   COORDINATION OF CARE: 11:01 PM Discussed ED treatment with pt      MDM   Nursing notes and vitals signs, including pulse oximetry, reviewed.  Summary of this visit's results, reviewed by myself:  Labs:  Results for orders placed during the hospital encounter of 05/23/12 (from the past 24 hour(s))  URINALYSIS, ROUTINE W REFLEX MICROSCOPIC     Status: Abnormal   Collection Time   05/23/12 10:05 PM      Component Value Range   Color, Urine YELLOW  YELLOW   APPearance CLEAR  CLEAR   Specific Gravity, Urine >1.030 (*) 1.005 - 1.030   pH 5.5  5.0 - 8.0  Glucose, UA NEGATIVE  NEGATIVE mg/dL   Hgb urine dipstick MODERATE (*) NEGATIVE   Bilirubin Urine NEGATIVE  NEGATIVE   Ketones, ur NEGATIVE  NEGATIVE mg/dL   Protein, ur NEGATIVE  NEGATIVE mg/dL   Urobilinogen, UA 0.2  0.0 - 1.0 mg/dL   Nitrite NEGATIVE  NEGATIVE   Leukocytes, UA TRACE (*) NEGATIVE  PREGNANCY, URINE     Status: Normal   Collection Time   05/23/12 10:05 PM      Component Value Range   Preg Test, Ur NEGATIVE  NEGATIVE  URINE MICROSCOPIC-ADD ON     Status: Abnormal   Collection Time   05/23/12 10:05 PM      Component Value Range   Squamous Epithelial / LPF FEW (*) RARE   WBC, UA 3-6  <3 WBC/hpf   RBC / HPF 11-20  <3 RBC/hpf   Bacteria, UA MANY (*) RARE  WET PREP, GENITAL      Status: Normal   Collection Time   05/23/12 11:35 PM      Component Value Range   Yeast Wet Prep HPF POC NONE SEEN  NONE SEEN   Trich, Wet Prep NONE SEEN  NONE SEEN   Clue Cells Wet Prep HPF POC NONE SEEN  NONE SEEN   WBC, Wet Prep HPF POC NONE SEEN  NONE SEEN   Patient states she is no longer on her menses. Given the number of red blood cells in her urine as well as her symptomatology we'll treat presumptively for urinary tract infection.    I personally performed the services described in this documentation, which was scribed in my presence.  The recorded information has been reviewed and is accurate.      Cynthia Seamen, MD 05/24/12 [phone]

## 2012-05-23 NOTE — ED Notes (Signed)
Dysuria,  Foul odor to urine, vag discharge, and skin lesion to buttock.

## 2012-05-24 MED ORDER — NITROFURANTOIN MONOHYD MACRO 100 MG PO CAPS
100.0000 mg | ORAL_CAPSULE | Freq: Once | ORAL | Status: AC
  Administered 2012-05-24: 100 mg via ORAL
  Filled 2012-05-24: qty 1

## 2012-05-24 MED ORDER — NITROFURANTOIN MONOHYD MACRO 100 MG PO CAPS: 100.0000 mg | ORAL_CAPSULE | Freq: Two times a day (BID) | ORAL | Status: DC

## 2012-05-25 LAB — GC/CHLAMYDIA PROBE AMP
CT Probe RNA: NEGATIVE
GC Probe RNA: NEGATIVE

## 2012-05-26 LAB — URINE CULTURE

## 2012-05-27 NOTE — ED Notes (Signed)
Positive urnc- treated per protocol.  

## 2012-09-01 ENCOUNTER — Emergency Department (HOSPITAL_COMMUNITY)
Admission: EM | Admit: 2012-09-01 | Discharge: 2012-09-01 | Disposition: A | Discharge status: Home / Self Care | Payer: Medicaid Other | Attending: Emergency Medicine | Admitting: Emergency Medicine

## 2012-09-01 ENCOUNTER — Encounter (HOSPITAL_COMMUNITY): Payer: Self-pay | Admitting: *Deleted

## 2012-09-01 DIAGNOSIS — L02419 Cutaneous abscess of limb, unspecified: Secondary | ICD-10-CM | POA: Insufficient documentation

## 2012-09-01 DIAGNOSIS — R21 Rash and other nonspecific skin eruption: Secondary | ICD-10-CM | POA: Insufficient documentation

## 2012-09-01 DIAGNOSIS — R011 Cardiac murmur, unspecified: Secondary | ICD-10-CM | POA: Insufficient documentation

## 2012-09-01 DIAGNOSIS — L0291 Cutaneous abscess, unspecified: Secondary | ICD-10-CM

## 2012-09-01 DIAGNOSIS — L03119 Cellulitis of unspecified part of limb: Secondary | ICD-10-CM | POA: Insufficient documentation

## 2012-09-01 DIAGNOSIS — Z79899 Other long term (current) drug therapy: Secondary | ICD-10-CM | POA: Insufficient documentation

## 2012-09-01 DIAGNOSIS — Z3202 Encounter for pregnancy test, result negative: Secondary | ICD-10-CM | POA: Insufficient documentation

## 2012-09-01 MED ORDER — SULFAMETHOXAZOLE-TRIMETHOPRIM 800-160 MG PO TABS: 1.0000 | ORAL_TABLET | Freq: Two times a day (BID) | ORAL | Status: DC

## 2012-09-01 MED ORDER — HYDROCODONE-ACETAMINOPHEN 5-325 MG PO TABS: 1.0000 | ORAL_TABLET | ORAL | Status: DC | PRN

## 2012-09-01 MED ORDER — IBUPROFEN 600 MG PO TABS: 600.0000 mg | ORAL_TABLET | Freq: Four times a day (QID) | ORAL | Status: DC | PRN

## 2012-09-01 NOTE — ED Notes (Signed)
Pustular raised areas to legs

## 2012-09-02 LAB — POCT PREGNANCY, URINE: Preg Test, Ur: NEGATIVE

## 2012-09-04 ENCOUNTER — Telehealth (HOSPITAL_COMMUNITY): Payer: Self-pay | Admitting: Emergency Medicine

## 2012-09-04 LAB — CULTURE, ROUTINE-ABSCESS

## 2012-09-04 NOTE — ED Provider Notes (Signed)
History     CSN: [id number]  Arrival date & time 09/01/12  1110   First MD Initiated Contact with Patient 09/01/12 1126      Chief Complaint  Patient presents with  . Recurrent Skin Infections    (Consider location/radiation/quality/duration/timing/severity/associated sxs/prior treatment) HPI Comments: Cynthia Molina is a 32 y.o. Female with several raised tender lesions on her legs.  She reports a history of similar episodes in the past and relate her daughter has been treated for mrsa in the past.  She denies fevers and chills and otherwise feels well.  The areas are tender to touch and pain free unless they are palpated.  She has tried warm soaks without relief.  There is no drainage from the wound sites.     The history is provided by the patient.    Past Medical History  Diagnosis Date  . Heart murmur     Past Surgical History  Procedure Laterality Date  . Cesarean section      History reviewed. No pertinent family history.  History  Substance Use Topics  . Smoking status: Never Smoker   . Smokeless tobacco: Not on file  . Alcohol Use: No    OB History   Grav Para Term Preterm Abortions TAB SAB Ect Mult Living                  Review of Systems  Constitutional: Negative for fever and chills.  HENT: Negative for facial swelling.   Respiratory: Negative for shortness of breath and wheezing.   Skin: Positive for rash.  Neurological: Negative for numbness.    Allergies  Compazine  Home Medications   Current Outpatient Rx  Name  Route  Sig  Dispense  Refill  . folic acid (FOLVITE) 1 MG tablet   Oral   Take 1 mg by mouth daily.         Marland Kitchen HYDROcodone-acetaminophen (NORCO/VICODIN) 5-325 MG per tablet   Oral   Take 1 tablet by mouth every 4 (four) hours as needed for pain.   8 tablet   0   . ibuprofen (ADVIL,MOTRIN) 600 MG tablet   Oral   Take 1 tablet (600 mg total) by mouth every 6 (six) hours as needed for pain.   21 tablet   0   .  sulfamethoxazole-trimethoprim (SEPTRA DS) 800-160 MG per tablet   Oral   Take 1 tablet by mouth every 12 (twelve) hours.   20 tablet   0     BP 111/58  Pulse 75  Temp(Src) 98.3 F (36.8 C) (Oral)  Resp 20  Ht 5\' 5"  (1.651 m)  Wt 170 lb (77.111 kg)  BMI 28.29 kg/m2  SpO2 100%  LMP 08/05/2012  Physical Exam  Constitutional: She appears well-developed and well-nourished. No distress.  HENT:  Head: Normocephalic.  Neck: Neck supple.  Cardiovascular: Normal rate.   Pulmonary/Chest: Effort normal. She has no wheezes.  Musculoskeletal: Normal range of motion. She exhibits no edema.  Skin:  Scattered slightly indurated papules on bilateral legs, one on left medial thigh, another on her right lower leg, both are nondraining and non fluctuant.  There is a 0.5 cm raised pustule left lower lateral leg with 1 cm surrounding erythema.  No red streaking.    ED Course  Procedures (including critical care time)   INCISION AND DRAINAGE Performed by: Burgess Amor Consent: Verbal consent obtained. Risks and benefits: risks, benefits and alternatives were discussed Type: abscess  Body area: left lower  leg  Anesthesia: none   The dome of this pustule was opened with a#18 gauge needed,  Multiple punctures made and the dome was completed deroofed,  pateint tolerated well Local anesthetic:na Anesthetic total: none Complexity: simple No blunt dissection to break up loculations indicated  Drainage: purulent  Drainage amount: small  Packing material: none  Patient tolerance: Patient tolerated the procedure well with no immediate complications.     Labs Reviewed  CULTURE, ROUTINE-ABSCESS  POCT PREGNANCY, URINE   No results found.   1. Abscess and cellulitis       MDM  Patient with raised pustule with no deep induration, and several scattered papules, most likely mrsa given family hx.  Prescribed bactrim, also prescribed hydrocodone and ibuprofen for pain.  Encouraged warm  soaks.  Recheck if not improving or if the other lesions progress.          Burgess Amor, PA-C 09/04/12 2107

## 2012-09-04 NOTE — ED Provider Notes (Signed)
Medical screening examination/treatment/procedure(s) were performed by non-physician practitioner and as supervising physician I was immediately available for consultation/collaboration.   Adaleigh Warf, MD 09/04/12 2212 

## 2012-09-05 ENCOUNTER — Telehealth (HOSPITAL_COMMUNITY): Payer: Self-pay | Admitting: Emergency Medicine

## 2012-09-05 NOTE — ED Notes (Signed)
+  Abscess. +MRSA. Patient treated with Septra. Sensitive to same. Per protocol MD.

## 2012-10-14 ENCOUNTER — Other Ambulatory Visit (HOSPITAL_COMMUNITY): Payer: Self-pay | Admitting: Family Medicine

## 2012-10-14 DIAGNOSIS — N63 Unspecified lump in unspecified breast: Secondary | ICD-10-CM

## 2012-10-26 ENCOUNTER — Ambulatory Visit (HOSPITAL_COMMUNITY)
Admission: RE | Admit: 2012-10-26 | Discharge: 2012-10-26 | Disposition: A | Discharge status: Home / Self Care | Payer: Medicaid Other | Source: Ambulatory Visit | Attending: Family Medicine | Admitting: Family Medicine

## 2012-10-26 ENCOUNTER — Other Ambulatory Visit (HOSPITAL_COMMUNITY): Payer: Self-pay | Admitting: Family Medicine

## 2012-10-26 DIAGNOSIS — N63 Unspecified lump in unspecified breast: Secondary | ICD-10-CM

## 2012-11-17 ENCOUNTER — Other Ambulatory Visit (HOSPITAL_COMMUNITY)
Admission: RE | Admit: 2012-11-17 | Discharge: 2012-11-17 | Disposition: A | Discharge status: Home / Self Care | Payer: Medicaid Other | Source: Ambulatory Visit | Attending: Family Medicine | Admitting: Family Medicine

## 2012-11-17 ENCOUNTER — Other Ambulatory Visit: Payer: Self-pay | Admitting: Family Medicine

## 2012-11-17 DIAGNOSIS — R87810 Cervical high risk human papillomavirus (HPV) DNA test positive: Secondary | ICD-10-CM | POA: Insufficient documentation

## 2012-11-17 DIAGNOSIS — Z01419 Encounter for gynecological examination (general) (routine) without abnormal findings: Secondary | ICD-10-CM | POA: Insufficient documentation

## 2012-11-17 DIAGNOSIS — Z1151 Encounter for screening for human papillomavirus (HPV): Secondary | ICD-10-CM | POA: Insufficient documentation

## 2012-12-13 ENCOUNTER — Encounter: Payer: Self-pay | Admitting: *Deleted

## 2012-12-20 ENCOUNTER — Encounter: Payer: Self-pay | Admitting: Obstetrics & Gynecology

## 2012-12-20 ENCOUNTER — Ambulatory Visit (INDEPENDENT_AMBULATORY_CARE_PROVIDER_SITE_OTHER): Payer: Medicaid Other | Admitting: Obstetrics & Gynecology

## 2012-12-20 VITALS — BP 120/80 | Wt 176.0 lb

## 2012-12-20 DIAGNOSIS — R87810 Cervical high risk human papillomavirus (HPV) DNA test positive: Secondary | ICD-10-CM

## 2012-12-20 DIAGNOSIS — R6889 Other general symptoms and signs: Secondary | ICD-10-CM

## 2012-12-20 DIAGNOSIS — IMO0002 Reserved for concepts with insufficient information to code with codable children: Secondary | ICD-10-CM | POA: Insufficient documentation

## 2012-12-20 NOTE — Progress Notes (Signed)
Patient ID: Cynthia Molina, female   DOB: 1980/12/30, 32 y.o.   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  My recommendation and indeed the general recommendation, is there is no clinical benefit to doing tissue examination or colposcopy in the scenario of normal cytology. Normal cytology by definition would have to be the basic building block of normal tissue. I have explained this extensively to patient and she understands and agrees Recommend repeat cytology with reflex HPV in 1 year.  The opposite would also be true.  +HPV with reflex cytology which again with normal cytology would not require further evluation with colposcopic evaluation.

## 2012-12-20 NOTE — Patient Instructions (Signed)
HPV Test The HPV (human papillomavirus) test is used to screen for high-risk types with HPV infection. HPV is a group of about 100 related viruses, of which 40 types are genital viruses. Most HPV viruses cause infections that usually resolve without treatment within 2 years. Some HPV infections can cause skin and genital warts (condylomata). HPV types 16, 18, 31 and 45 are considered high-risk types of HPV. High-risk types of HPV do not usually cause visible warts, but if untreated, may lead to cancers of the outlet of the womb (cervix) or anus. An HPV test identifies the DNA (genetic) strands of the HPV infection. Because the test identifies the DNA strands, the test is also referred to as the HPV DNA test. Although HPV is found in both males and females, the HPV test is only used to screen for cervical cancer in females. This test is recommended for females:  With an abnormal Pap test.  After treatment of an abnormal Pap test.  Aged 30 and older.  After treatment of a high-risk HPV infection. The HPV test may be done at the same time as a Pap test in females over the age of 30. Both the HPV and Pap test require a sample of cells from the cervix. PREPARATION FOR TEST  You may be asked to avoid douching, tampons, or vaginal medicines for 48 hours before the HPV test. You will be asked to urinate before the test. For the HPV test, you will need to lie on an exam table with your feet in stirrups. A spatula will be inserted into the vagina. The spatula will be used to swab the cervix for a cell and mucus sample. The sample will be further evaluated in a lab under a microscope. NORMAL FINDINGS  Normal: High-risk HPV is not found.  Ranges for normal findings may vary among different laboratories and hospitals. You should always check with your doctor after having lab work or other tests done to discuss the meaning of your test results and whether your values are considered within normal limits. MEANING  OF TEST An abnormal HPV test means that high-risk HPV is found. Your caregiver may recommend further testing. Your caregiver will go over the test results with you. He or she will and discuss the importance and meaning of your results, as well as treatment options and the need for additional tests, if necessary. OBTAINING THE RESULTS  It is your responsibility to obtain your test results. Ask the lab or department performing the test when and how you will get your results. Document Released: 06/12/2004 Document Revised: 08/10/2011 Document Reviewed: 02/25/2005 [employer] Patient Information 2014 [employer].  

## 2013-01-19 ENCOUNTER — Ambulatory Visit: Payer: Medicaid Other | Admitting: Obstetrics & Gynecology

## 2013-02-02 ENCOUNTER — Ambulatory Visit (INDEPENDENT_AMBULATORY_CARE_PROVIDER_SITE_OTHER): Payer: Medicaid Other | Admitting: Obstetrics & Gynecology

## 2013-02-02 ENCOUNTER — Encounter: Payer: Self-pay | Admitting: Obstetrics & Gynecology

## 2013-02-02 VITALS — BP 110/70 | Ht 65.0 in | Wt 178.0 lb

## 2013-02-02 DIAGNOSIS — O020 Blighted ovum and nonhydatidiform mole: Secondary | ICD-10-CM | POA: Insufficient documentation

## 2013-02-02 DIAGNOSIS — O0289 Other abnormal products of conception: Secondary | ICD-10-CM

## 2013-02-02 NOTE — Progress Notes (Signed)
Patient ID: Cynthia Molina, female   DOB: [birth year], 32 y.o.   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

## 2013-07-06 ENCOUNTER — Encounter (INDEPENDENT_AMBULATORY_CARE_PROVIDER_SITE_OTHER): Payer: Self-pay

## 2013-07-06 ENCOUNTER — Encounter: Payer: Self-pay | Admitting: Obstetrics & Gynecology

## 2013-07-06 ENCOUNTER — Ambulatory Visit (INDEPENDENT_AMBULATORY_CARE_PROVIDER_SITE_OTHER): Payer: Medicaid Other | Admitting: Obstetrics & Gynecology

## 2013-07-06 VITALS — BP 110/60 | Wt 177.0 lb

## 2013-07-06 DIAGNOSIS — N760 Acute vaginitis: Secondary | ICD-10-CM | POA: Insufficient documentation

## 2013-07-06 MED ORDER — METRONIDAZOLE 500 MG PO TABS: 500.0000 mg | ORAL_TABLET | Freq: Two times a day (BID) | ORAL | Status: DC

## 2013-07-06 NOTE — Progress Notes (Signed)
Patient ID: Cynthia Molina, female   DOB: 1980/07/14, 32 y.o.   MRN: [id number] Cynthia Molina comes in to the office complaining of vaginal pain No pelvic pain Some discharge No itching No burning No dyspareunia Just vaginal pain  Exam Normal external genitalia Vagina is pink and moist with some discharge Wet prep is positive for bacterial vaginosis no yeast no trichomonas No cervical motion tenderness on bimanual Uterus is normal-sized and nontender adnexa is normal size and nontender  Vaginal bacterial vaginosis  Recommend Metronidazole 500 mg twice a day for 7 days I will followup in 2 weeks for reevaluation

## 2013-07-21 ENCOUNTER — Ambulatory Visit: Payer: Medicaid Other | Admitting: Obstetrics & Gynecology

## 2013-07-23 ENCOUNTER — Emergency Department (HOSPITAL_COMMUNITY): Payer: Medicaid Other

## 2013-07-23 ENCOUNTER — Emergency Department (HOSPITAL_COMMUNITY)
Admission: EM | Admit: 2013-07-23 | Discharge: 2013-07-23 | Disposition: A | Discharge status: Home / Self Care | Payer: Medicaid Other | Attending: Emergency Medicine | Admitting: Emergency Medicine

## 2013-07-23 ENCOUNTER — Encounter (HOSPITAL_COMMUNITY): Payer: Self-pay | Admitting: Emergency Medicine

## 2013-07-23 DIAGNOSIS — R011 Cardiac murmur, unspecified: Secondary | ICD-10-CM | POA: Insufficient documentation

## 2013-07-23 DIAGNOSIS — O200 Threatened abortion: Secondary | ICD-10-CM

## 2013-07-23 DIAGNOSIS — Z8619 Personal history of other infectious and parasitic diseases: Secondary | ICD-10-CM | POA: Insufficient documentation

## 2013-07-23 DIAGNOSIS — R109 Unspecified abdominal pain: Secondary | ICD-10-CM | POA: Insufficient documentation

## 2013-07-23 DIAGNOSIS — Z9889 Other specified postprocedural states: Secondary | ICD-10-CM | POA: Insufficient documentation

## 2013-07-23 LAB — CBC WITH DIFFERENTIAL/PLATELET
Basophils Absolute: 0 10*3/uL (ref 0.0–0.1)
Basophils Relative: 0 % (ref 0–1)
EOS PCT: 1 % (ref 0–5)
Eosinophils Absolute: 0.1 10*3/uL (ref 0.0–0.7)
HEMATOCRIT: 36.7 % (ref 36.0–46.0)
Hemoglobin: 12.5 g/dL (ref 12.0–15.0)
LYMPHS PCT: 21 % (ref 12–46)
Lymphs Abs: 2.7 10*3/uL (ref 0.7–4.0)
MCH: 30 pg (ref 26.0–34.0)
MCHC: 34.1 g/dL (ref 30.0–36.0)
MCV: 88.2 fL (ref 78.0–100.0)
MONO ABS: 0.9 10*3/uL (ref 0.1–1.0)
Monocytes Relative: 7 % (ref 3–12)
NEUTROS ABS: 8.8 10*3/uL — AB (ref 1.7–7.7)
Neutrophils Relative %: 70 % (ref 43–77)
Platelets: 350 10*3/uL (ref 150–400)
RBC: 4.16 MIL/uL (ref 3.87–5.11)
RDW: 12.7 % (ref 11.5–15.5)
WBC: 12.5 10*3/uL — ABNORMAL HIGH (ref 4.0–10.5)

## 2013-07-23 LAB — URINALYSIS, ROUTINE W REFLEX MICROSCOPIC
BILIRUBIN URINE: NEGATIVE
GLUCOSE, UA: NEGATIVE mg/dL
HGB URINE DIPSTICK: NEGATIVE
KETONES UR: NEGATIVE mg/dL
LEUKOCYTES UA: NEGATIVE
Nitrite: NEGATIVE
PH: 7 (ref 5.0–8.0)
Protein, ur: NEGATIVE mg/dL
Specific Gravity, Urine: 1.01 (ref 1.005–1.030)
Urobilinogen, UA: 0.2 mg/dL (ref 0.0–1.0)

## 2013-07-23 LAB — POC URINE PREG, ED: Preg Test, Ur: POSITIVE — AB

## 2013-07-23 LAB — WET PREP, GENITAL
Clue Cells Wet Prep HPF POC: NONE SEEN
TRICH WET PREP: NONE SEEN
YEAST WET PREP: NONE SEEN

## 2013-07-23 LAB — HCG, QUANTITATIVE, PREGNANCY: hCG, Beta Chain, Quant, S: 8134 m[IU]/mL — ABNORMAL HIGH (ref <5)

## 2013-07-23 LAB — ABO/RH: ABO/RH(D): B POS

## 2013-07-23 MED ORDER — METOCLOPRAMIDE HCL 10 MG PO TABS: 10.0000 mg | ORAL_TABLET | Freq: Four times a day (QID) | ORAL | Status: DC | PRN

## 2013-07-23 MED ORDER — ONDANSETRON 8 MG PO TBDP: ORAL_TABLET | ORAL | Status: DC

## 2013-07-23 NOTE — ED Provider Notes (Signed)
CSN: [id number]     Arrival date & time 07/23/13  1814 History   First MD Initiated Contact with Patient 07/23/13 1839   This chart was scribed for Babette Relic, MD by Terressa Koyanagi, ED Scribe and Jenne Campus, ED Scribe. This patient was seen in room APA03/APA03 and the patient's care was started at 6:43 PM.   Chief Complaint  Patient presents with  . Abdominal Pain  . Nausea   The history is provided by the patient. No language interpreter was used.   HPI Comments: Cynthia Molina is a 33 y.o. female who presents to the Emergency Department complaining of intermittent nausea that started a few days ago with associated mild, lower abdominal cramping that started today. She denies having any sxs currently. Pt was recently treated for vaginitis with flagyl which improved the symptoms. However, pt complains of recurring scant vaginal discharge over the last few days. Patient denies dysuria, vomiting or diarrhea. Pt has a history of molar pregnancy and G3P1. Pt is a non-smoker and denies alcohol or illegal drug use. She denies any prior abdominal surgeries other than a c-section. She denies any h/o DM.  Past Medical History  Diagnosis Date  . Heart murmur   . Hx of trichomoniasis   . HPV (human papilloma virus) infection    Past Surgical History  Procedure Laterality Date  . Cesarean section    . Elective abortion     History reviewed. No pertinent family history. History  Substance Use Topics  . Smoking status: Never Smoker   . Smokeless tobacco: Never Used  . Alcohol Use: No   OB History   Grav Para Term Preterm Abortions TAB SAB Ect Mult Living   4 1   2  2         Review of Systems  10 Systems reviewed and are negative for acute change except as noted in the HPI.  Allergies  Compazine  Home Medications   Current Outpatient Rx  Name  Route  Sig  Dispense  Refill  . ibuprofen (ADVIL,MOTRIN) 600 MG tablet   Oral   Take 1 tablet (600 mg total) by mouth every 6 (six)  hours as needed for pain.   21 tablet   0   . metoCLOPramide (REGLAN) 10 MG tablet   Oral   Take 1 tablet (10 mg total) by mouth every 6 (six) hours as needed for nausea (nausea/headache).   6 tablet   0   . ondansetron (ZOFRAN ODT) 8 MG disintegrating tablet      8mg  ODT q4 hours prn nausea   4 tablet   0    BP 110/64  Pulse 99  Temp(Src) 98.2 F (36.8 C) (Oral)  Resp 20  Ht 5\' 6"  (1.676 m)  Wt 176 lb (79.833 kg)  BMI 28.42 kg/m2  SpO2 100%  LMP 06/15/2013 Physical Exam  Nursing note and vitals reviewed. Constitutional: She appears well-developed and well-nourished.  Awake, alert, nontoxic appearance.  HENT:  Head: Atraumatic.  Eyes: Right eye exhibits no discharge. Left eye exhibits no discharge.  Neck: Neck supple.  Cardiovascular: Normal rate, regular rhythm and normal heart sounds.   No murmur heard. Pulmonary/Chest: Effort normal and breath sounds normal. She has no rales. She exhibits no tenderness.  Abdominal: Soft. Bowel sounds are normal. She exhibits no mass. There is no tenderness. There is no rebound.  No CVAT   Genitourinary:  Chaperone present for pelvic examination; scant white cervical discharge no bleeding cervix is  closed no cervical motion tenderness no adnexal tenderness internal os closed  Musculoskeletal: She exhibits no tenderness.  Baseline ROM, no obvious new focal weakness.  Neurological:  Mental status and motor strength appears baseline for patient and situation.  Skin: No rash noted.  Psychiatric: She has a normal mood and affect.    ED Course  Procedures (including critical care time) DIAGNOSTIC STUDIES: Oxygen Saturation is 100% on room air, normal by my interpretation.    COORDINATION OF CARE: 6:47 PM-Discussed treatment plan which includes urine test and pregnancy test, but no CT Scan because no current abd pain with pt at bedside and pt agreed to plan.  HCG positive limited bedside US TA apparent small Gsac cannot visualize  yolk sac or fetal pole so formal TV US ordered r/o ectopic. Pt stable in ED with no significant deterioration in condition.Patient / Family informed of clinical course, understand medical decision-making process, and agree with plan.  Labs Review Labs Reviewed  WET PREP, GENITAL - Abnormal; Notable for the following:    WBC, Wet Prep HPF POC FEW (*)    All other components within normal limits  URINALYSIS, ROUTINE W REFLEX MICROSCOPIC - Abnormal; Notable for the following:    APPearance HAZY (*)    All other components within normal limits  CBC WITH DIFFERENTIAL - Abnormal; Notable for the following:    WBC 12.5 (*)    Neutro Abs 8.8 (*)    All other components within normal limits  HCG, QUANTITATIVE, PREGNANCY - Abnormal; Notable for the following:    hCG, Beta Chain, Quant, S 8134 (*)    All other components within normal limits  POC URINE PREG, ED - Abnormal; Notable for the following:    Preg Test, Ur POSITIVE (*)    All other components within normal limits  GC/CHLAMYDIA PROBE AMP  ABO/RH   Imaging Review US Ob Comp Less 14 Wks  07/23/2013   CLINICAL DATA:  Abdominal pain.  Positive beta HCG.  EXAM: OBSTETRIC <14 WK [place] AND TRANSVAGINAL OB US  TECHNIQUE: Both transabdominal and transvaginal ultrasound examinations were performed for complete evaluation of the gestation as well as the maternal uterus, adnexal regions, and pelvic cul-de-sac. Transvaginal technique was performed to assess early pregnancy.  COMPARISON:  None.  FINDINGS: Intrauterine gestational sac: Single.  Yolk sac:  Yes  Embryo:  No  Cardiac Activity: No  MSD:  9.8  mm   5 w   5  d  [place] EDC: 03/20/2014  Maternal uterus/adnexae: No subchorionic hemorrhage. Right ovary is normal. There are 2 cysts in the left ovary, the largest being 3.4 cm.  No visible ectopic pregnancy. Small amount of free fluid in the pelvis.  IMPRESSION: Single intrauterine pregnancy of approximately 5 weeks 5 days gestation. There is no visible embryo  or cardiac activity at this time.   Electronically Signed   By: Rozetta Nunnery M.D.   On: 07/23/2013 21:34   US Ob Transvaginal  07/23/2013   CLINICAL DATA:  Abdominal pain.  Positive beta HCG.  EXAM: OBSTETRIC <14 WK [place] AND TRANSVAGINAL OB US  TECHNIQUE: Both transabdominal and transvaginal ultrasound examinations were performed for complete evaluation of the gestation as well as the maternal uterus, adnexal regions, and pelvic cul-de-sac. Transvaginal technique was performed to assess early pregnancy.  COMPARISON:  None.  FINDINGS: Intrauterine gestational sac: Single.  Yolk sac:  Yes  Embryo:  No  Cardiac Activity: No  MSD:  9.8  mm   5 w  5  d  [place] EDC: 03/20/2014  Maternal uterus/adnexae: No subchorionic hemorrhage. Right ovary is normal. There are 2 cysts in the left ovary, the largest being 3.4 cm.  No visible ectopic pregnancy. Small amount of free fluid in the pelvis.  IMPRESSION: Single intrauterine pregnancy of approximately 5 weeks 5 days gestation. There is no visible embryo or cardiac activity at this time.   Electronically Signed   By: Rozetta Nunnery M.D.   On: 07/23/2013 21:34    EKG Interpretation   None       MDM   Final diagnoses:  Threatened miscarriage   I doubt any other EMC precluding discharge at this time including, but not necessarily limited to the following:ectopic pregnancy. I personally performed the services described in this documentation, which was scribed in my presence. The recorded information has been reviewed and is accurate.     Babette Relic, MD 07/25/13 [phone]

## 2013-07-23 NOTE — ED Notes (Signed)
MD at bedside. 

## 2013-07-23 NOTE — ED Notes (Addendum)
Patient complaining of nausea x 4 days with abdominal cramping starting today. Patient states she may be pregnant.

## 2013-07-23 NOTE — Discharge Instructions (Signed)
Bleeding during the first 20 weeks of pregnancy is common. This is sometimes called a threatened miscarriage. This is a pregnancy that is threatening to end before the twentieth week of pregnancy.  Miscarriages occur in 7 to 20% of all pregnancies and usually occur during the first 13 weeks of the pregnancy. The exact cause of a miscarriage is usually never known. A miscarriage is natures way of ending a pregnancy that is abnormal or would not make it to term.  HOME CARE INSTRUCTIONS DO NOT USE TAMPONS. Do not douche, have sexual intercourse or orgasms until approved by your caregiver.  Call for re-evaluation of your pregnancy and possible repeat blood test. Re-evaluation often occurs after 2 days.  If you are Rh negative and the father is Rh positive or you do not know the fathers blood type, you may receive a shot (Rh immune globulin) to help prevent abnormal antibodies that can develop and affect the baby in any future pregnancies. SEEK IMMEDIATE MEDICAL ATTENTION IF: You have severe cramps in your stomach, back, or abdomen.  You have a sudden onset of severe pain in the lower part of your abdomen.  You run an unexplained temperature of 101 F (38.3 C) or higher.  You pass large clots or tissue. Save any tissue for your caregiver to inspect.  Your bleeding increases or you become light-headed, weak, or have fainting episodes.

## 2013-07-24 ENCOUNTER — Ambulatory Visit (INDEPENDENT_AMBULATORY_CARE_PROVIDER_SITE_OTHER): Payer: Medicaid Other | Admitting: Obstetrics & Gynecology

## 2013-07-24 ENCOUNTER — Encounter: Payer: Self-pay | Admitting: Obstetrics & Gynecology

## 2013-07-24 VITALS — BP 98/60 | Wt 172.0 lb

## 2013-07-24 DIAGNOSIS — Z3480 Encounter for supervision of other normal pregnancy, unspecified trimester: Secondary | ICD-10-CM

## 2013-07-24 DIAGNOSIS — N760 Acute vaginitis: Secondary | ICD-10-CM

## 2013-07-24 DIAGNOSIS — O2390 Unspecified genitourinary tract infection in pregnancy, unspecified trimester: Secondary | ICD-10-CM

## 2013-07-24 LAB — GC/CHLAMYDIA PROBE AMP
CT Probe RNA: NEGATIVE
GC Probe RNA: NEGATIVE

## 2013-07-24 NOTE — Progress Notes (Signed)
Patient ID: Cynthia Molina, female   DOB: 28-Oct-1980, 34 y.o.   MRN: [id number] Pt in for follow up  Went to ER last night with minimla cramping, no bleeding found to be pregnant  In today for follow up of her BV.  Exam Cleared discharge   Follow up 1 week for sonogram and 1st ob appt

## 2013-07-28 ENCOUNTER — Emergency Department (HOSPITAL_COMMUNITY)
Admission: EM | Admit: 2013-07-28 | Discharge: 2013-07-28 | Discharge status: Against Medical Advice | Payer: Medicaid Other | Attending: Emergency Medicine | Admitting: Emergency Medicine

## 2013-07-28 DIAGNOSIS — R110 Nausea: Secondary | ICD-10-CM | POA: Insufficient documentation

## 2013-07-28 DIAGNOSIS — E860 Dehydration: Secondary | ICD-10-CM | POA: Insufficient documentation

## 2013-07-28 DIAGNOSIS — O9989 Other specified diseases and conditions complicating pregnancy, childbirth and the puerperium: Secondary | ICD-10-CM | POA: Insufficient documentation

## 2013-07-28 DIAGNOSIS — R011 Cardiac murmur, unspecified: Secondary | ICD-10-CM | POA: Insufficient documentation

## 2013-07-30 ENCOUNTER — Encounter (HOSPITAL_COMMUNITY): Payer: Self-pay | Admitting: Emergency Medicine

## 2013-07-30 ENCOUNTER — Emergency Department (HOSPITAL_COMMUNITY)
Admission: EM | Admit: 2013-07-30 | Discharge: 2013-07-30 | Disposition: A | Discharge status: Home / Self Care | Payer: Medicaid Other | Attending: Emergency Medicine | Admitting: Emergency Medicine

## 2013-07-30 DIAGNOSIS — R5383 Other fatigue: Secondary | ICD-10-CM

## 2013-07-30 DIAGNOSIS — E860 Dehydration: Secondary | ICD-10-CM

## 2013-07-30 DIAGNOSIS — R5381 Other malaise: Secondary | ICD-10-CM | POA: Insufficient documentation

## 2013-07-30 DIAGNOSIS — R350 Frequency of micturition: Secondary | ICD-10-CM | POA: Insufficient documentation

## 2013-07-30 DIAGNOSIS — O9989 Other specified diseases and conditions complicating pregnancy, childbirth and the puerperium: Secondary | ICD-10-CM | POA: Insufficient documentation

## 2013-07-30 DIAGNOSIS — R011 Cardiac murmur, unspecified: Secondary | ICD-10-CM | POA: Insufficient documentation

## 2013-07-30 DIAGNOSIS — R112 Nausea with vomiting, unspecified: Secondary | ICD-10-CM

## 2013-07-30 DIAGNOSIS — Z3490 Encounter for supervision of normal pregnancy, unspecified, unspecified trimester: Secondary | ICD-10-CM

## 2013-07-30 DIAGNOSIS — R197 Diarrhea, unspecified: Secondary | ICD-10-CM | POA: Insufficient documentation

## 2013-07-30 DIAGNOSIS — Z8619 Personal history of other infectious and parasitic diseases: Secondary | ICD-10-CM | POA: Insufficient documentation

## 2013-07-30 DIAGNOSIS — O210 Mild hyperemesis gravidarum: Secondary | ICD-10-CM | POA: Insufficient documentation

## 2013-07-30 LAB — CBC WITH DIFFERENTIAL/PLATELET
Basophils Absolute: 0 10*3/uL (ref 0.0–0.1)
Basophils Relative: 0 % (ref 0–1)
EOS PCT: 0 % (ref 0–5)
Eosinophils Absolute: 0 10*3/uL (ref 0.0–0.7)
HCT: 35.8 % — ABNORMAL LOW (ref 36.0–46.0)
HEMOGLOBIN: 12.1 g/dL (ref 12.0–15.0)
LYMPHS ABS: 1.9 10*3/uL (ref 0.7–4.0)
LYMPHS PCT: 17 % (ref 12–46)
MCH: 29.5 pg (ref 26.0–34.0)
MCHC: 33.8 g/dL (ref 30.0–36.0)
MCV: 87.3 fL (ref 78.0–100.0)
MONO ABS: 0.9 10*3/uL (ref 0.1–1.0)
MONOS PCT: 8 % (ref 3–12)
Neutro Abs: 8.5 10*3/uL — ABNORMAL HIGH (ref 1.7–7.7)
Neutrophils Relative %: 75 % (ref 43–77)
Platelets: 323 10*3/uL (ref 150–400)
RBC: 4.1 MIL/uL (ref 3.87–5.11)
RDW: 12.6 % (ref 11.5–15.5)
WBC: 11.3 10*3/uL — AB (ref 4.0–10.5)

## 2013-07-30 LAB — COMPREHENSIVE METABOLIC PANEL
ALT: 10 U/L (ref 0–35)
AST: 10 U/L (ref 0–37)
Albumin: 3.7 g/dL (ref 3.5–5.2)
Alkaline Phosphatase: 52 U/L (ref 39–117)
BILIRUBIN TOTAL: 0.5 mg/dL (ref 0.3–1.2)
BUN: 6 mg/dL (ref 6–23)
CALCIUM: 9.4 mg/dL (ref 8.4–10.5)
CHLORIDE: 96 meq/L (ref 96–112)
CO2: 25 meq/L (ref 19–32)
CREATININE: 0.63 mg/dL (ref 0.50–1.10)
GLUCOSE: 104 mg/dL — AB (ref 70–99)
Potassium: 3.6 mEq/L — ABNORMAL LOW (ref 3.7–5.3)
Sodium: 133 mEq/L — ABNORMAL LOW (ref 137–147)
Total Protein: 8.1 g/dL (ref 6.0–8.3)

## 2013-07-30 LAB — URINALYSIS, ROUTINE W REFLEX MICROSCOPIC
Bilirubin Urine: NEGATIVE
Glucose, UA: NEGATIVE mg/dL
HGB URINE DIPSTICK: NEGATIVE
Ketones, ur: NEGATIVE mg/dL
Leukocytes, UA: NEGATIVE
NITRITE: NEGATIVE
PROTEIN: NEGATIVE mg/dL
SPECIFIC GRAVITY, URINE: 1.01 (ref 1.005–1.030)
UROBILINOGEN UA: 0.2 mg/dL (ref 0.0–1.0)
pH: 7.5 (ref 5.0–8.0)

## 2013-07-30 MED ORDER — DIPHENHYDRAMINE HCL 50 MG/ML IJ SOLN
25.0000 mg | Freq: Once | INTRAMUSCULAR | Status: AC
  Administered 2013-07-30: 25 mg via INTRAVENOUS
  Filled 2013-07-30: qty 1

## 2013-07-30 MED ORDER — SODIUM CHLORIDE 0.9 % IV BOLUS (SEPSIS)
1000.0000 mL | Freq: Once | INTRAVENOUS | Status: AC
  Administered 2013-07-30: 1000 mL via INTRAVENOUS

## 2013-07-30 MED ORDER — SODIUM CHLORIDE 0.9 % IV SOLN: 1000.0000 mL | INTRAVENOUS | Status: DC

## 2013-07-30 MED ORDER — METOCLOPRAMIDE HCL 5 MG/ML IJ SOLN
10.0000 mg | Freq: Once | INTRAMUSCULAR | Status: AC
  Administered 2013-07-30: 10 mg via INTRAVENOUS
  Filled 2013-07-30: qty 2

## 2013-07-30 MED ORDER — SODIUM CHLORIDE 0.9 % IV SOLN
1000.0000 mL | Freq: Once | INTRAVENOUS | Status: AC
  Administered 2013-07-30: 1000 mL via INTRAVENOUS

## 2013-07-30 MED ORDER — ONDANSETRON HCL 4 MG/2ML IJ SOLN
4.0000 mg | Freq: Once | INTRAMUSCULAR | Status: AC
  Administered 2013-07-30: 4 mg via INTRAVENOUS
  Filled 2013-07-30: qty 2

## 2013-07-30 MED ORDER — METOCLOPRAMIDE HCL 10 MG PO TABS: 10.0000 mg | ORAL_TABLET | Freq: Four times a day (QID) | ORAL | Status: DC | PRN

## 2013-07-30 NOTE — ED Notes (Signed)
Pt with emesis, states she is pregnant-unable to state how far along in pregnancy, pt also c/o nervousness

## 2013-07-30 NOTE — Discharge Instructions (Signed)
Drink plenty of fluids. Try to nibble throughout the day to help with your nausea (crackers, cereal). Use the reglan for your nausea or vomiting. Keep your OB appointment in 2 days. Return if you feel worse again.    Pregnancy - First Trimester During sexual intercourse, millions of sperm go into the vagina. Only 1 sperm will penetrate and fertilize the female egg while it is in the Fallopian tube. One week later, the fertilized egg implants into the wall of the uterus. An embryo begins to develop into a baby. At 6 to 8 weeks, the eyes and face are formed and the heartbeat can be seen on ultrasound. At the end of 12 weeks (first trimester), all the baby's organs are formed. Now that you are pregnant, you will want to do everything you can to have a healthy baby. Two of the most important things are to get good prenatal care and follow your caregiver's instructions. Prenatal care is all the medical care you receive before the baby's birth. It is given to prevent, find, and treat problems during the pregnancy and childbirth. PRENATAL EXAMS  During prenatal visits, your weight, blood pressure, and urine are checked. This is done to make sure you are healthy and progressing normally during the pregnancy.  A pregnant woman should gain 25 to 35 pounds during the pregnancy. However, if you are overweight or underweight, your caregiver will advise you regarding your weight.  Your caregiver will ask and answer questions for you.  Blood work, cervical cultures, other necessary tests, and a Pap test are done during your prenatal exams. These tests are done to check on your health and the probable health of your baby. Tests are strongly recommended and done for HIV with your permission. This is the virus that causes AIDS. These tests are done because medicines can be given to help prevent your baby from being born with this infection should you have been infected without knowing it. Blood work is also used to find  out your blood type, previous infections, and follow your blood levels (hemoglobin).  Low hemoglobin (anemia) is common during pregnancy. Iron and vitamins are given to help prevent this. Later in the pregnancy, blood tests for diabetes will be done along with any other tests if any problems develop.  You may need other tests to make sure you and the baby are doing well. CHANGES DURING THE FIRST TRIMESTER  Your body goes through many changes during pregnancy. They vary from person to person. Talk to your caregiver about changes you notice and are concerned about. Changes can include:  Your menstrual period stops.  The egg and sperm carry the genes that determine what you look like. Genes from you and your partner are forming a baby. The female genes determine whether the baby is a boy or a girl.  Your body increases in girth and you may feel bloated.  Feeling sick to your stomach (nauseous) and throwing up (vomiting). If the vomiting is uncontrollable, call your caregiver.  Your breasts will begin to enlarge and become tender.  Your nipples may stick out more and become darker.  The need to urinate more. Painful urination may mean you have a bladder infection.  Tiring easily.  Loss of appetite.  Cravings for certain kinds of food.  At first, you may gain or lose a couple of pounds.  You may have changes in your emotions from day to day (excited to be pregnant or concerned something may go wrong with the  pregnancy and baby).  You may have more vivid and strange dreams. HOME CARE INSTRUCTIONS   It is very important to avoid all smoking, alcohol and non-prescribed drugs during your pregnancy. These affect the formation and growth of the baby. Avoid chemicals while pregnant to ensure the delivery of a healthy infant.  Start your prenatal visits by the 12th week of pregnancy. They are usually scheduled monthly at first, then more often in the last 2 months before delivery. Keep your  caregiver's appointments. Follow your caregiver's instructions regarding medicine use, blood and lab tests, exercise, and diet.  During pregnancy, you are providing food for you and your baby. Eat regular, well-balanced meals. Choose foods such as meat, fish, milk and other low fat dairy products, vegetables, fruits, and whole-grain breads and cereals. Your caregiver will tell you of the ideal weight gain.  You can help morning sickness by keeping soda crackers at the bedside. Eat a couple before arising in the morning. You may want to use the crackers without salt on them.  Eating 4 to 5 small meals rather than 3 large meals a day also may help the nausea and vomiting.  Drinking liquids between meals instead of during meals also seems to help nausea and vomiting.  A physical sexual relationship may be continued throughout pregnancy if there are no other problems. Problems may be early (premature) leaking of amniotic fluid from the membranes, vaginal bleeding, or belly (abdominal) pain.  Exercise regularly if there are no restrictions. Check with your caregiver or physical therapist if you are unsure of the safety of some of your exercises. Greater weight gain will occur in the last 2 trimesters of pregnancy. Exercising will help:  Control your weight.  Keep you in shape.  Prepare you for labor and delivery.  Help you lose your pregnancy weight after you deliver your baby.  Wear a good support or jogging bra for breast tenderness during pregnancy. This may help if worn during sleep too.  Ask when prenatal classes are available. Begin classes when they are offered.  Do not use hot tubs, steam rooms, or saunas.  Wear your seat belt when driving. This protects you and your baby if you are in an accident.  Avoid raw meat, uncooked cheese, cat litter boxes, and soil used by cats throughout the pregnancy. These carry germs that can cause birth defects in the baby.  The first trimester is a  good time to visit your dentist for your dental health. Getting your teeth cleaned is okay. Use a softer toothbrush and brush gently during pregnancy.  Ask for help if you have financial, counseling, or nutritional needs during pregnancy. Your caregiver will be able to offer counseling for these needs as well as refer you for other special needs.  Do not take any medicines or herbs unless told by your caregiver.  Inform your caregiver if there is any mental or physical domestic violence.  Make a list of emergency phone numbers of family, friends, hospital, and police and fire departments.  Write down your questions. Take them to your prenatal visit.  Do not douche.  Do not cross your legs.  If you have to stand for long periods of time, rotate you feet or take small steps in a circle.  You may have more vaginal secretions that may require a sanitary pad. Do not use tampons or scented sanitary pads. MEDICINES AND DRUG USE IN PREGNANCY  Take prenatal vitamins as directed. The vitamin should contain 1 milligram  of folic acid. Keep all vitamins out of reach of children. Only a couple vitamins or tablets containing iron may be fatal to a baby or young child when ingested.  Avoid use of all medicines, including herbs, over-the-counter medicines, not prescribed or suggested by your caregiver. Only take over-the-counter or prescription medicines for pain, discomfort, or fever as directed by your caregiver. Do not use aspirin, ibuprofen, or naproxen unless directed by your caregiver.  Let your caregiver also know about herbs you may be using.  Alcohol is related to a number of birth defects. This includes fetal alcohol syndrome. All alcohol, in any form, should be avoided completely. Smoking will cause low birth rate and premature babies.  Street or illegal drugs are very harmful to the baby. They are absolutely forbidden. A baby born to an addicted mother will be addicted at birth. The baby will  go through the same withdrawal an adult does.  Let your caregiver know about any medicines that you have to take and for what reason you take them. SEEK MEDICAL CARE IF:  You have any concerns or worries during your pregnancy. It is better to call with your questions if you feel they cannot wait, rather than worry about them. SEEK IMMEDIATE MEDICAL CARE IF:   An unexplained oral temperature above 102 F (38.9 C) develops, or as your caregiver suggests.  You have leaking of fluid from the vagina (birth canal). If leaking membranes are suspected, take your temperature and inform your caregiver of this when you call.  There is vaginal spotting or bleeding. Notify your caregiver of the amount and how many pads are used.  You develop a bad smelling vaginal discharge with a change in the color.  You continue to feel sick to your stomach (nauseated) and have no relief from remedies suggested. You vomit blood or coffee ground-like materials.  You lose more than 2 pounds of weight in 1 week.  You gain more than 2 pounds of weight in 1 week and you notice swelling of your face, hands, feet, or legs.  You gain 5 pounds or more in 1 week (even if you do not have swelling of your hands, face, legs, or feet).  You get exposed to [place] measles and have never had them.  You are exposed to fifth disease or chickenpox.  You develop belly (abdominal) pain. Round ligament discomfort is a common non-cancerous (benign) cause of abdominal pain in pregnancy. Your caregiver still must evaluate this.  You develop headache, fever, diarrhea, pain with urination, or shortness of breath.  You fall or are in a car accident or have any kind of trauma.  There is mental or physical violence in your home. Document Released: 05/12/2001 Document Revised: 02/10/2012 Document Reviewed: 11/13/2008 [facility] Patient Information 2014 [place].

## 2013-07-30 NOTE — ED Notes (Signed)
Pt given ginger ale per order. Pt tolerating well.

## 2013-07-30 NOTE — ED Provider Notes (Signed)
CSN: [id number]     Arrival date & time 07/30/13  1420 History  This chart was scribed for Janice Norrie, MD by Roxan Diesel, ED scribe.  This patient was seen in room APA04/APA04 and the patient's care was started at 2:50 PM.   Chief Complaint  Patient presents with  . Nausea    The history is provided by the patient. No language interpreter was used.    HPI Comments: Cynthia Molina is a 33 y.o. female (pregnant, G4P1A2--one molar pregnancy) who presents to the Emergency Department complaining of 3 days of persistent nausea   Pt states she has been nauseated for several days and does not want to eat or drink.  She denies vomiting.  She also notes some urinary frequency that began 4-5 days ago.  She also had one episode of diarrhea yesterday.  She denies measured fever, abdominal pain, lightheadedness, dysuria, or vaginal spotting or bleeding.  She does feel weak.  Pt states she is pregnant but is not sure how far along she is.  She has an appointment with an OB/GYN in 2 days.  LNMP was on January 15th and was normal.  She reports that her last pregnancy 2 years ago was a molar pregnancy.  She did not need to receive chemotherapy.  PCP Dr Berdine Addison GYN Dr Elonda Husky  Past Medical History  Diagnosis Date  . Heart murmur   . Hx of trichomoniasis   . HPV (human papilloma virus) infection     Past Surgical History  Procedure Laterality Date  . Cesarean section    . Elective abortion      History reviewed. No pertinent family history.   History  Substance Use Topics  . Smoking status: Never Smoker   . Smokeless tobacco: Never Used  . Alcohol Use: No    OB History   Grav Para Term Preterm Abortions TAB SAB Ect Mult Living   5 1   2  2           Review of Systems  Constitutional: Negative for fever.  Gastrointestinal: Positive for nausea and diarrhea. Negative for vomiting and abdominal pain.  Genitourinary: Negative for dysuria and vaginal bleeding.  Neurological: Negative for  weakness and light-headedness.  All other systems reviewed and are negative.      Allergies  Compazine  Home Medications   Current Outpatient Rx  Name  Route  Sig  Dispense  Refill  . ibuprofen (ADVIL,MOTRIN) 600 MG tablet   Oral   Take 1 tablet (600 mg total) by mouth every 6 (six) hours as needed for pain.   21 tablet   0   . metoCLOPramide (REGLAN) 10 MG tablet   Oral   Take 1 tablet (10 mg total) by mouth every 6 (six) hours as needed for nausea (nausea/headache).   6 tablet   0   . ondansetron (ZOFRAN ODT) 8 MG disintegrating tablet      8mg  ODT q4 hours prn nausea   4 tablet   0    BP 107/52  Pulse 79  Temp(Src) 98 F (36.7 C) (Oral)  Resp 16  Ht 5\' 5"  (1.651 m)  Wt 170 lb (77.111 kg)  BMI 28.29 kg/m2  SpO2 100%  LMP 06/15/2013  Vital signs normal    Physical Exam  Nursing note and vitals reviewed. Constitutional: She is oriented to person, place, and time. She appears well-developed and well-nourished.  Non-toxic appearance. She does not appear ill. No distress.  HENT:  Head:  Normocephalic and atraumatic.  Right Ear: External ear normal.  Left Ear: External ear normal.  Nose: Nose normal. No mucosal edema or rhinorrhea.  Mouth/Throat: Oropharynx is clear and moist and mucous membranes are normal. No dental abscesses or uvula swelling. No oropharyngeal exudate.  Eyes: Conjunctivae and EOM are normal. Pupils are equal, round, and reactive to light.  Neck: Normal range of motion and full passive range of motion without pain. Neck supple.  Cardiovascular: Normal rate, regular rhythm and normal heart sounds.  Exam reveals no gallop and no friction rub.   No murmur heard. Pulmonary/Chest: Effort normal and breath sounds normal. No respiratory distress. She has no wheezes. She has no rhonchi. She has no rales. She exhibits no tenderness and no crepitus.  Abdominal: Soft. Normal appearance and bowel sounds are normal. She exhibits no distension. There is no  tenderness. There is no rebound and no guarding.  Musculoskeletal: Normal range of motion. She exhibits no edema and no tenderness.  Moves all extremities well.   Neurological: She is alert and oriented to person, place, and time. She has normal strength. No cranial nerve deficit.  Skin: Skin is warm, dry and intact. No rash noted. No erythema. No pallor.  Psychiatric:  Flat affect    ED Course  Procedures (including critical care time)  Medications  0.9 %  sodium chloride infusion (0 mLs Intravenous Stopped 07/30/13 1644)    Followed by  0.9 %  sodium chloride infusion (not administered)  ondansetron (ZOFRAN) injection 4 mg (4 mg Intravenous Given 07/30/13 1520)  sodium chloride 0.9 % bolus 1,000 mL (0 mLs Intravenous Stopped 07/30/13 1921)  metoCLOPramide (REGLAN) injection 10 mg (10 mg Intravenous Given 07/30/13 1917)  diphenhydrAMINE (BENADRYL) injection 25 mg (25 mg Intravenous Given 07/30/13 1918)  sodium chloride 0.9 % bolus 1,000 mL (1,000 mLs Intravenous New Bag/Given 07/30/13 1917)     DIAGNOSTIC STUDIES: Oxygen Saturation is 100% on room air, normal by my interpretation.    COORDINATION OF CARE: 3:02 PM-Discussed treatment plan which includes IV fluids, anti-emetics, and labs with pt at bedside and pt agreed to plan.   17:31 pt has gotten her IV fluids and is feeling better. Smiling Is willing to try to drink fluids. Of note she is alone in her room.   18:30 Pt is now frowning, states she is nauseated again. Will try different nausea meds. Her significant other is in room.  20:10 smiling, states she is feeling better. Is in room alone. Feels ready to go home.  Her IV bag is almost finished.     Results for orders placed during the hospital encounter of 07/30/13  CBC WITH DIFFERENTIAL      Result Value Ref Range   WBC 11.3 (*) 4.0 - 10.5 K/uL   RBC 4.10  3.87 - 5.11 MIL/uL   Hemoglobin 12.1  12.0 - 15.0 g/dL   HCT 35.8 (*) 36.0 - 46.0 %   MCV 87.3  78.0 - 100.0 fL   MCH  29.5  26.0 - 34.0 pg   MCHC 33.8  30.0 - 36.0 g/dL   RDW 12.6  11.5 - 15.5 %   Platelets 323  150 - 400 K/uL   Neutrophils Relative % 75  43 - 77 %   Neutro Abs 8.5 (*) 1.7 - 7.7 K/uL   Lymphocytes Relative 17  12 - 46 %   Lymphs Abs 1.9  0.7 - 4.0 K/uL   Monocytes Relative 8  3 - 12 %  Monocytes Absolute 0.9  0.1 - 1.0 K/uL   Eosinophils Relative 0  0 - 5 %   Eosinophils Absolute 0.0  0.0 - 0.7 K/uL   Basophils Relative 0  0 - 1 %   Basophils Absolute 0.0  0.0 - 0.1 K/uL  COMPREHENSIVE METABOLIC PANEL      Result Value Ref Range   Sodium 133 (*) 137 - 147 mEq/L   Potassium 3.6 (*) 3.7 - 5.3 mEq/L   Chloride 96  96 - 112 mEq/L   CO2 25  19 - 32 mEq/L   Glucose, Bld 104 (*) 70 - 99 mg/dL   BUN 6  6 - 23 mg/dL   Creatinine, Ser 0.63  0.50 - 1.10 mg/dL   Calcium 9.4  8.4 - 10.5 mg/dL   Total Protein 8.1  6.0 - 8.3 g/dL   Albumin 3.7  3.5 - 5.2 g/dL   AST 10  0 - 37 U/L   ALT 10  0 - 35 U/L   Alkaline Phosphatase 52  39 - 117 U/L   Total Bilirubin 0.5  0.3 - 1.2 mg/dL   GFR calc non Af Amer >90  >90 mL/min   GFR calc Af Amer >90  >90 mL/min  URINALYSIS, ROUTINE W REFLEX MICROSCOPIC      Result Value Ref Range   Color, Urine YELLOW  YELLOW   APPearance HAZY (*) CLEAR   Specific Gravity, Urine 1.010  1.005 - 1.030   pH 7.5  5.0 - 8.0   Glucose, UA NEGATIVE  NEGATIVE mg/dL   Hgb urine dipstick NEGATIVE  NEGATIVE   Bilirubin Urine NEGATIVE  NEGATIVE   Ketones, ur NEGATIVE  NEGATIVE mg/dL   Protein, ur NEGATIVE  NEGATIVE mg/dL   Urobilinogen, UA 0.2  0.0 - 1.0 mg/dL   Nitrite NEGATIVE  NEGATIVE   Leukocytes, UA NEGATIVE  NEGATIVE   Laboratory interpretation all normal except except mildly low potassium    MDM   Final diagnoses:  Nausea and vomiting  Pregnancy  Dehydration   New Prescriptions   METOCLOPRAMIDE (REGLAN) 10 MG TABLET    Take 1 tablet (10 mg total) by mouth every 6 (six) hours as needed for nausea or vomiting.    Plan discharge   Rolland Porter, MD,  FACEP    I personally performed the services described in this documentation, which was scribed in my presence. The recorded information has been reviewed and considered.  Rolland Porter, MD, Abram Sander    Janice Norrie, MD 07/30/13 2032

## 2013-08-01 ENCOUNTER — Emergency Department (HOSPITAL_COMMUNITY)
Admission: EM | Admit: 2013-08-01 | Discharge: 2013-08-01 | Disposition: A | Discharge status: Home / Self Care | Payer: Medicaid Other | Attending: Emergency Medicine | Admitting: Emergency Medicine

## 2013-08-01 ENCOUNTER — Encounter (HOSPITAL_COMMUNITY): Payer: Self-pay | Admitting: Emergency Medicine

## 2013-08-01 ENCOUNTER — Other Ambulatory Visit: Payer: Medicaid Other

## 2013-08-01 DIAGNOSIS — R5383 Other fatigue: Secondary | ICD-10-CM

## 2013-08-01 DIAGNOSIS — R6883 Chills (without fever): Secondary | ICD-10-CM | POA: Insufficient documentation

## 2013-08-01 DIAGNOSIS — Z79899 Other long term (current) drug therapy: Secondary | ICD-10-CM | POA: Insufficient documentation

## 2013-08-01 DIAGNOSIS — K117 Disturbances of salivary secretion: Secondary | ICD-10-CM | POA: Insufficient documentation

## 2013-08-01 DIAGNOSIS — O9989 Other specified diseases and conditions complicating pregnancy, childbirth and the puerperium: Secondary | ICD-10-CM | POA: Insufficient documentation

## 2013-08-01 DIAGNOSIS — R5381 Other malaise: Secondary | ICD-10-CM | POA: Insufficient documentation

## 2013-08-01 DIAGNOSIS — R011 Cardiac murmur, unspecified: Secondary | ICD-10-CM | POA: Insufficient documentation

## 2013-08-01 DIAGNOSIS — Z8619 Personal history of other infectious and parasitic diseases: Secondary | ICD-10-CM | POA: Insufficient documentation

## 2013-08-01 LAB — CBG MONITORING, ED: Glucose-Capillary: 84 mg/dL (ref 70–99)

## 2013-08-01 MED ORDER — ONDANSETRON HCL 8 MG PO TABS: 8.0000 mg | ORAL_TABLET | Freq: Three times a day (TID) | ORAL | Status: DC | PRN

## 2013-08-01 MED ORDER — ONDANSETRON 8 MG PO TBDP
8.0000 mg | ORAL_TABLET | Freq: Once | ORAL | Status: DC
  Filled 2013-08-01: qty 1

## 2013-08-01 MED ORDER — ONDANSETRON 8 MG PO TBDP
8.0000 mg | ORAL_TABLET | Freq: Once | ORAL | Status: AC
  Administered 2013-08-01: 8 mg via ORAL

## 2013-08-01 NOTE — ED Provider Notes (Signed)
CSN: [id number]     Arrival date & time 08/01/13  0206 History   First MD Initiated Contact with Patient 08/01/13 [phone]     Chief Complaint  Patient presents with  . Chills      HPI Pt reports she is having chills.  She also reports she has dry mouth.  She reports she keeps spitting No vomiting/diarrhea reported She is able to take PO fluids No fever No abd pain.  No vag bleeding She reports generalized fatigue  Pt is currently pregnant at about 6 weeks.  Past Medical History  Diagnosis Date  . Heart murmur   . Hx of trichomoniasis   . HPV (human papilloma virus) infection    Past Surgical History  Procedure Laterality Date  . Cesarean section    . Elective abortion     History reviewed. No pertinent family history. History  Substance Use Topics  . Smoking status: Never Smoker   . Smokeless tobacco: Never Used  . Alcohol Use: No   OB History   Grav Para Term Preterm Abortions TAB SAB Ect Mult Living   5 1   2  2         Review of Systems  Constitutional: Positive for chills.  Cardiovascular: Negative for chest pain.  Gastrointestinal: Negative for vomiting.  Genitourinary: Negative for dysuria.  All other systems reviewed and are negative.      Allergies  Compazine  Home Medications   Current Outpatient Rx  Name  Route  Sig  Dispense  Refill  . Ginger, Zingiber officinalis, (GINGER ROOT PO)   Oral   Take 1 tablet by mouth daily.         . metoCLOPramide (REGLAN) 10 MG tablet   Oral   Take 1 tablet (10 mg total) by mouth every 6 (six) hours as needed for nausea (nausea/headache).   6 tablet   0   . metoCLOPramide (REGLAN) 10 MG tablet   Oral   Take 1 tablet (10 mg total) by mouth every 6 (six) hours as needed for nausea or vomiting.   30 tablet   0   . ondansetron (ZOFRAN) 8 MG tablet   Oral   Take 1 tablet (8 mg total) by mouth every 8 (eight) hours as needed for nausea.   4 tablet   0    BP 99/54  Pulse 82  Temp(Src) 98.9 F (37.2  C) (Oral)  Resp 20  Ht 5\' 5"  (1.651 m)  Wt 170 lb (77.111 kg)  BMI 28.29 kg/m2  SpO2 100%  LMP 06/15/2013 Physical Exam CONSTITUTIONAL: Well developed/well nourished HEAD: Normocephalic/atraumatic EYES: EOMI/PERRL ENMT: Mucous membranes moist, uvula midline, no erythema noted, voice normal NECK: supple no meningeal signs CV: S1/S2 noted, no murmurs/rubs/gallops noted LUNGS: Lungs are clear to auscultation bilaterally, no apparent distress ABDOMEN: soft, nontender, no rebound or guarding GU:no cva tenderness NEURO: Pt is awake/alert, moves all extremitiesx4, pt is ambulatory without difficulty, no ataxia EXTREMITIES: pulses normal, full ROM SKIN: warm, color normal PSYCH: no abnormalities of mood noted  ED Course  Procedures  Labs Review Labs Reviewed  CBG MONITORING, ED     MDM   Final diagnoses:  Chills    Nursing notes including past medical history and social history reviewed and considered in documentation Labs/vital reviewed and considered Previous records reviewed and considered   4:24 AM Pt just seen in the ED on 3/1 with reassuring labs She has had 1st trimester [place] to confirm IUP She is not actively  vomiting  she denies any pain Advised to use zofran Discussed need to call her OB tomorrow if these symptoms continue    Sharyon Cable, MD 08/01/13 0425

## 2013-08-01 NOTE — ED Notes (Signed)
Pt states she is dehydrated because she is having chills and her mouth is dry.

## 2013-08-03 ENCOUNTER — Ambulatory Visit: Payer: Medicaid Other | Admitting: Obstetrics & Gynecology

## 2013-08-04 ENCOUNTER — Encounter: Payer: Self-pay | Admitting: *Deleted

## 2013-08-09 ENCOUNTER — Ambulatory Visit: Payer: Medicaid Other | Admitting: Obstetrics & Gynecology

## 2013-08-10 ENCOUNTER — Ambulatory Visit: Payer: Medicaid Other | Admitting: Obstetrics & Gynecology

## 2013-09-08 ENCOUNTER — Encounter: Payer: Self-pay | Admitting: Obstetrics & Gynecology

## 2013-09-08 ENCOUNTER — Ambulatory Visit (INDEPENDENT_AMBULATORY_CARE_PROVIDER_SITE_OTHER): Payer: Medicaid Other | Admitting: Obstetrics & Gynecology

## 2013-09-08 VITALS — BP 100/60 | Wt 179.0 lb

## 2013-09-08 DIAGNOSIS — Z3200 Encounter for pregnancy test, result unknown: Secondary | ICD-10-CM

## 2013-09-08 DIAGNOSIS — O210 Mild hyperemesis gravidarum: Secondary | ICD-10-CM

## 2013-09-08 NOTE — Progress Notes (Signed)
Patient ID: Cynthia Molina, female   DOB: 1980-06-22, 33 y.o.   MRN: [id number] Pt recently terminated Now questionably pregnant Do HCG  Follow up based on result

## 2013-09-09 LAB — HCG, QUANTITATIVE, PREGNANCY

## 2013-09-15 ENCOUNTER — Telehealth: Payer: Self-pay | Admitting: Obstetrics and Gynecology

## 2013-09-15 NOTE — Telephone Encounter (Signed)
Left message x 1. JSY 

## 2013-09-18 ENCOUNTER — Telehealth: Payer: Self-pay | Admitting: Obstetrics and Gynecology

## 2013-09-18 NOTE — Telephone Encounter (Signed)
Pt informed of negative QHCG from 09/08/13, call transferred to front staff for appointment to be scheduled with Dr. Elonda Husky.

## 2013-09-18 NOTE — Telephone Encounter (Signed)
Left message @ 3:16pm. JSY

## 2013-09-20 NOTE — Telephone Encounter (Signed)
Left message @ 8:52am. JSY

## 2013-12-09 ENCOUNTER — Encounter (HOSPITAL_COMMUNITY): Payer: Self-pay | Admitting: Emergency Medicine

## 2013-12-09 ENCOUNTER — Emergency Department (HOSPITAL_COMMUNITY)
Admission: EM | Admit: 2013-12-09 | Discharge: 2013-12-10 | Disposition: A | Discharge status: Home / Self Care | Payer: Medicaid Other | Attending: Emergency Medicine | Admitting: Emergency Medicine

## 2013-12-09 DIAGNOSIS — R011 Cardiac murmur, unspecified: Secondary | ICD-10-CM | POA: Insufficient documentation

## 2013-12-09 DIAGNOSIS — R102 Pelvic and perineal pain: Secondary | ICD-10-CM

## 2013-12-09 DIAGNOSIS — A499 Bacterial infection, unspecified: Secondary | ICD-10-CM | POA: Diagnosis not present

## 2013-12-09 DIAGNOSIS — N949 Unspecified condition associated with female genital organs and menstrual cycle: Secondary | ICD-10-CM | POA: Insufficient documentation

## 2013-12-09 DIAGNOSIS — R109 Unspecified abdominal pain: Secondary | ICD-10-CM | POA: Diagnosis present

## 2013-12-09 DIAGNOSIS — B9689 Other specified bacterial agents as the cause of diseases classified elsewhere: Secondary | ICD-10-CM | POA: Insufficient documentation

## 2013-12-09 DIAGNOSIS — N760 Acute vaginitis: Secondary | ICD-10-CM | POA: Diagnosis not present

## 2013-12-09 DIAGNOSIS — Z3202 Encounter for pregnancy test, result negative: Secondary | ICD-10-CM | POA: Diagnosis not present

## 2013-12-09 LAB — URINALYSIS, ROUTINE W REFLEX MICROSCOPIC
BILIRUBIN URINE: NEGATIVE
Glucose, UA: NEGATIVE mg/dL
HGB URINE DIPSTICK: NEGATIVE
KETONES UR: NEGATIVE mg/dL
Leukocytes, UA: NEGATIVE
NITRITE: NEGATIVE
Protein, ur: NEGATIVE mg/dL
Urobilinogen, UA: 0.2 mg/dL (ref 0.0–1.0)
pH: 6 (ref 5.0–8.0)

## 2013-12-09 LAB — PREGNANCY, URINE: Preg Test, Ur: NEGATIVE

## 2013-12-09 MED ORDER — NAPROXEN 250 MG PO TABS
500.0000 mg | ORAL_TABLET | Freq: Once | ORAL | Status: AC
  Administered 2013-12-09: 500 mg via ORAL
  Filled 2013-12-09: qty 2

## 2013-12-09 NOTE — ED Notes (Signed)
Pt c/o lower left abdominal pain with nausea.

## 2013-12-09 NOTE — ED Provider Notes (Signed)
CSN: [id number]     Arrival date & time 12/09/13  2123 History  This chart was scribed for Johnna Acosta, MD by Girtha Hake, ED Scribe. The patient was seen in APA06/APA06. The patient's care was started at 11:29 PM.   Chief Complaint  Patient presents with  . Abdominal Pain   HPI HPI Comments: Cynthia Molina is a 33 y.o. female who presents to the Emergency Department complaining of intermittent abdominal cramping beginning twp month ago after an abortion. She reports associated abdominal pain beginning one week ago. Patient reports associated nausea. Denies fever, chills, vaginal discharge, vomiting, dysuria, hematuria, constipation, or hematochezia.  Notices right adnexal pain seven days after menses. Improves during menses. Currently pain right adnexa. No radiation, no voimting, fever, dysuria, or vaginal discharge.   Patient is not taking any hormone pills. Denies IUD. LNMP was July 5.   Past Medical History  Diagnosis Date  . Heart murmur   . Hx of trichomoniasis   . HPV (human papilloma virus) infection    Past Surgical History  Procedure Laterality Date  . Cesarean section    . Elective abortion     History reviewed. No pertinent family history. History  Substance Use Topics  . Smoking status: Never Smoker   . Smokeless tobacco: Never Used  . Alcohol Use: No   OB History   Grav Para Term Preterm Abortions TAB SAB Ect Mult Living   5 1   2  2         Review of Systems  Constitutional: Negative for fever and chills.  Gastrointestinal: Positive for nausea and abdominal pain. Negative for vomiting, diarrhea, constipation and blood in stool.  Genitourinary: Negative for dysuria, hematuria and vaginal discharge.  All other systems reviewed and are negative.     Allergies  Compazine  Home Medications   Prior to Admission medications   Medication Sig Start Date End Date Taking? Authorizing Provider  metroNIDAZOLE (FLAGYL) 500 MG tablet Take 1 tablet (500 mg  total) by mouth 2 (two) times daily. 12/10/13   Johnna Acosta, MD  naproxen (NAPROSYN) 500 MG tablet Take 1 tablet (500 mg total) by mouth 2 (two) times daily with a meal. 12/10/13   Johnna Acosta, MD   Triage Vitals: BP 118/65  Pulse 87  Temp(Src) 98 F (36.7 C) (Oral)  Resp 20  Ht 5\' 6"  (1.676 m)  Wt 178 lb (80.74 kg)  BMI 28.74 kg/m2  SpO2 100%  LMP 12/03/2013  Breastfeeding? Unknown Physical Exam  Nursing note and vitals reviewed. Constitutional: She is oriented to person, place, and time. She appears well-developed and well-nourished. No distress.  HENT:  Head: Normocephalic and atraumatic.  Eyes: Conjunctivae and EOM are normal.  Neck: Neck supple. No tracheal deviation present.  Cardiovascular: Normal rate.   Pulmonary/Chest: Effort normal. No respiratory distress.  Abdominal: There is tenderness (At right adnexa.). There is no guarding.  No pain at McBurney's point. No peritoneal signs.   Genitourinary:  Chaperone present. No TTP or masses. Small amount of brown discharge. Cervix closed and nontender.   Musculoskeletal: Normal range of motion.  Neurological: She is alert and oriented to person, place, and time.  Skin: Skin is warm and dry.  Psychiatric: She has a normal mood and affect. Her behavior is normal.    ED Course  Procedures (including critical care time) DIAGNOSTIC STUDIES: Oxygen Saturation is 100% on room air, normal by my interpretation.    COORDINATION OF CARE: 11:35 PM-Discussed  treatment plan which includes Gc/ [place] / wet prep / preg UA) with pt at bedside and pt agreed to plan.     Labs Review Labs Reviewed  WET PREP, GENITAL - Abnormal; Notable for the following:    Clue Cells Wet Prep HPF POC MODERATE (*)    All other components within normal limits  URINALYSIS, ROUTINE W REFLEX MICROSCOPIC - Abnormal; Notable for the following:    Specific Gravity, Urine >1.030 (*)    All other components within normal limits  GC/CHLAMYDIA PROBE AMP   PREGNANCY, URINE    Imaging Review No results found.    MDM   Final diagnoses:  Pain in female pelvis  BV (bacterial vaginosis)    Pt has BV, no other acute findings - [place] on Monday morning - doubt appendictis, pt in agreement,  Meds given in ED:  Medications  naproxen (NAPROSYN) tablet 500 mg (500 mg Oral Given 12/09/13 2355)    New Prescriptions   METRONIDAZOLE (FLAGYL) 500 MG TABLET    Take 1 tablet (500 mg total) by mouth 2 (two) times daily.   NAPROXEN (NAPROSYN) 500 MG TABLET    Take 1 tablet (500 mg total) by mouth 2 (two) times daily with a meal.      I personally performed the services described in this documentation, which was scribed in my presence. The recorded information has been reviewed and is accurate.      Johnna Acosta, MD 12/10/13 [phone]

## 2013-12-10 LAB — WET PREP, GENITAL
Trich, Wet Prep: NONE SEEN
WBC, Wet Prep HPF POC: NONE SEEN
YEAST WET PREP: NONE SEEN

## 2013-12-10 MED ORDER — METRONIDAZOLE 500 MG PO TABS: 500.0000 mg | ORAL_TABLET | Freq: Two times a day (BID) | ORAL | Status: DC

## 2013-12-10 MED ORDER — NAPROXEN 500 MG PO TABS: 500.0000 mg | ORAL_TABLET | Freq: Two times a day (BID) | ORAL | Status: DC

## 2013-12-11 ENCOUNTER — Ambulatory Visit (HOSPITAL_COMMUNITY)
Admit: 2013-12-11 | Discharge: 2013-12-11 | Disposition: A | Discharge status: Home / Self Care | Payer: Medicaid Other | Source: Ambulatory Visit | Attending: Emergency Medicine | Admitting: Emergency Medicine

## 2013-12-11 ENCOUNTER — Other Ambulatory Visit (HOSPITAL_COMMUNITY): Payer: Self-pay | Admitting: Emergency Medicine

## 2013-12-11 DIAGNOSIS — O26899 Other specified pregnancy related conditions, unspecified trimester: Secondary | ICD-10-CM

## 2013-12-11 DIAGNOSIS — N83209 Unspecified ovarian cyst, unspecified side: Secondary | ICD-10-CM | POA: Diagnosis not present

## 2013-12-11 DIAGNOSIS — R102 Pelvic and perineal pain: Secondary | ICD-10-CM

## 2013-12-11 DIAGNOSIS — R1031 Right lower quadrant pain: Secondary | ICD-10-CM | POA: Diagnosis present

## 2013-12-11 LAB — GC/CHLAMYDIA PROBE AMP
CT Probe RNA: NEGATIVE
GC Probe RNA: NEGATIVE

## 2013-12-11 NOTE — ED Provider Notes (Signed)
She returned today, for followup ultrasound to assess her abdominal pain. She states the naproxen is helping her pain. The ultrasound done today is normal. She has a PCP, for followup and is in agreement with doing that. I explained to her that her pain was nonspecific and may continue to improve with naproxen, but that she should followup with her primary care doctor.  Richarda Blade, MD 12/11/13 [phone]

## 2014-01-18 ENCOUNTER — Other Ambulatory Visit (HOSPITAL_COMMUNITY)
Admission: RE | Admit: 2014-01-18 | Discharge: 2014-01-18 | Disposition: A | Discharge status: Home / Self Care | Payer: Medicaid Other | Source: Ambulatory Visit | Attending: Family Medicine | Admitting: Family Medicine

## 2014-01-18 DIAGNOSIS — Z1151 Encounter for screening for human papillomavirus (HPV): Secondary | ICD-10-CM | POA: Diagnosis present

## 2014-01-18 DIAGNOSIS — Z01419 Encounter for gynecological examination (general) (routine) without abnormal findings: Secondary | ICD-10-CM | POA: Diagnosis present

## 2014-01-18 DIAGNOSIS — R87810 Cervical high risk human papillomavirus (HPV) DNA test positive: Secondary | ICD-10-CM | POA: Diagnosis present

## 2014-02-10 ENCOUNTER — Emergency Department (HOSPITAL_COMMUNITY)
Admission: EM | Admit: 2014-02-10 | Discharge: 2014-02-10 | Disposition: A | Discharge status: Home / Self Care | Payer: Medicaid Other | Attending: Emergency Medicine | Admitting: Emergency Medicine

## 2014-02-10 ENCOUNTER — Encounter (HOSPITAL_COMMUNITY): Payer: Self-pay | Admitting: Emergency Medicine

## 2014-02-10 DIAGNOSIS — Z79899 Other long term (current) drug therapy: Secondary | ICD-10-CM | POA: Insufficient documentation

## 2014-02-10 DIAGNOSIS — N3090 Cystitis, unspecified without hematuria: Secondary | ICD-10-CM

## 2014-02-10 DIAGNOSIS — Z791 Long term (current) use of non-steroidal anti-inflammatories (NSAID): Secondary | ICD-10-CM | POA: Insufficient documentation

## 2014-02-10 DIAGNOSIS — R300 Dysuria: Secondary | ICD-10-CM | POA: Diagnosis present

## 2014-02-10 DIAGNOSIS — Z8619 Personal history of other infectious and parasitic diseases: Secondary | ICD-10-CM | POA: Insufficient documentation

## 2014-02-10 DIAGNOSIS — R011 Cardiac murmur, unspecified: Secondary | ICD-10-CM | POA: Diagnosis not present

## 2014-02-10 LAB — URINE MICROSCOPIC-ADD ON

## 2014-02-10 LAB — URINALYSIS, ROUTINE W REFLEX MICROSCOPIC
BILIRUBIN URINE: NEGATIVE
GLUCOSE, UA: NEGATIVE mg/dL
Ketones, ur: NEGATIVE mg/dL
Nitrite: NEGATIVE
PH: 6 (ref 5.0–8.0)
PROTEIN: NEGATIVE mg/dL
Specific Gravity, Urine: 1.015 (ref 1.005–1.030)
Urobilinogen, UA: 0.2 mg/dL (ref 0.0–1.0)

## 2014-02-10 MED ORDER — NITROFURANTOIN MACROCRYSTAL 100 MG PO CAPS
100.0000 mg | ORAL_CAPSULE | Freq: Once | ORAL | Status: AC
  Administered 2014-02-10: 100 mg via ORAL
  Filled 2014-02-10: qty 1

## 2014-02-10 MED ORDER — NITROFURANTOIN MONOHYD MACRO 100 MG PO CAPS: 100.0000 mg | ORAL_CAPSULE | Freq: Two times a day (BID) | ORAL | Status: DC

## 2014-02-10 MED ORDER — PHENAZOPYRIDINE HCL 100 MG PO TABS
100.0000 mg | ORAL_TABLET | Freq: Once | ORAL | Status: AC
  Administered 2014-02-10: 100 mg via ORAL
  Filled 2014-02-10: qty 1

## 2014-02-10 MED ORDER — PHENAZOPYRIDINE HCL 200 MG PO TABS: 200.0000 mg | ORAL_TABLET | Freq: Three times a day (TID) | ORAL | Status: DC

## 2014-02-10 NOTE — ED Provider Notes (Addendum)
CSN: [id number]     Arrival date & time 02/10/14  6213 History   First MD Initiated Contact with Patient 02/10/14 0737   This chart was scribed for Nat Christen, MD by Evelene Croon, ED Scribe. This patient was seen in room APA19/APA19 and the patient's care was started 7:47 AM.    Chief Complaint  Patient presents with  . Dysuria     The history is provided by the patient.    HPI Comments:  Cynthia Molina is a 33 y.o. female who presents to the Emergency Department complaining of urinary symptoms that started about one week ago. Pt reports dysuria, urinary urgency and frequency with decreased urinary output. Pt also reports h/o the same, states she was diagnosed with an UTI. Pt denies hematuria and notes she has been drinking cranberry juice with no relief. No fever or chills. Severity is mild.  Nothing makes symptoms better or worse    Past Medical History  Diagnosis Date  . Heart murmur   . Hx of trichomoniasis   . HPV (human papilloma virus) infection    Past Surgical History  Procedure Laterality Date  . Cesarean section    . Elective abortion     History reviewed. No pertinent family history. History  Substance Use Topics  . Smoking status: Never Smoker   . Smokeless tobacco: Never Used  . Alcohol Use: No   OB History   Grav Para Term Preterm Abortions TAB SAB Ect Mult Living   5 1   2  2         Review of Systems  All other systems reviewed and are negative.  10 systems reviewed and negative other than pertinent ROS in HPI   Allergies  Compazine  Home Medications   Prior to Admission medications   Medication Sig Start Date End Date Taking? Authorizing Provider  metroNIDAZOLE (FLAGYL) 500 MG tablet Take 1 tablet (500 mg total) by mouth 2 (two) times daily. 12/10/13   Johnna Acosta, MD  naproxen (NAPROSYN) 500 MG tablet Take 1 tablet (500 mg total) by mouth 2 (two) times daily with a meal. 12/10/13   Johnna Acosta, MD  nitrofurantoin,  macrocrystal-monohydrate, (MACROBID) 100 MG capsule Take 1 capsule (100 mg total) by mouth 2 (two) times daily. X 7 days 02/10/14   Nat Christen, MD  phenazopyridine (PYRIDIUM) 200 MG tablet Take 1 tablet (200 mg total) by mouth 3 (three) times daily. 02/10/14   Nat Christen, MD   BP 118/78  Pulse 60  Temp(Src) 98.1 F (36.7 C) (Oral)  Resp 18  Ht 5\' 6"  (1.676 m)  Wt 170 lb (77.111 kg)  BMI 27.45 kg/m2  SpO2 99%  LMP 01/30/2014  Breastfeeding? No Physical Exam  Nursing note and vitals reviewed. Constitutional: She is oriented to person, place, and time. She appears well-developed and well-nourished.  HENT:  Head: Normocephalic and atraumatic.  Eyes: Conjunctivae and EOM are normal. Pupils are equal, round, and reactive to light.  Neck: Normal range of motion. Neck supple.  Cardiovascular: Normal rate, regular rhythm and normal heart sounds.   Pulmonary/Chest: Effort normal and breath sounds normal.  Abdominal: Soft. Bowel sounds are normal.  Musculoskeletal: Normal range of motion.  Neurological: She is alert and oriented to person, place, and time.  Skin: Skin is warm and dry.  Psychiatric: She has a normal mood and affect. Her behavior is normal.    ED Course  Procedures (including critical care time)   DIAGNOSTIC STUDIES:  Oxygen  Saturation is 99% on RA, normal by my interpretation.    COORDINATION OF CARE:  7:50 AM Discussed treatment plan with pt at bedside and pt agreed to plan.  Labs Review Labs Reviewed  URINALYSIS, ROUTINE W REFLEX MICROSCOPIC - Abnormal; Notable for the following:    APPearance HAZY (*)    Hgb urine dipstick TRACE (*)    Leukocytes, UA MODERATE (*)    All other components within normal limits  URINE MICROSCOPIC-ADD ON - Abnormal; Notable for the following:    Squamous Epithelial / LPF MANY (*)    Bacteria, UA MANY (*)    All other components within normal limits    Imaging Review No results found.   EKG Interpretation None      MDM    Final diagnoses:  Cystitis    History and physical consistent with uncomplicated cystitis.   Rx Macrobid and pyridium  I personally performed the services described in this documentation, which was scribed in my presence. The recorded information has been reviewed and is accurate.   Nat Christen, MD 02/10/14 2122  Nat Christen, MD 02/10/14 [phone]

## 2014-02-10 NOTE — ED Notes (Signed)
Pt reports dysuria and urinary frequency, urinary odor. Pt denies any hematuria,abdominal pain or fever. nad noted.

## 2014-02-10 NOTE — Discharge Instructions (Signed)
Urinary Tract Infection A urinary tract infection (UTI) can occur any place along the urinary tract. The tract includes the kidneys, ureters, bladder, and urethra. A type of germ called bacteria often causes a UTI. UTIs are often helped with antibiotic medicine.  HOME CARE   If given, take antibiotics as told by your doctor. Finish them even if you start to feel better.  Drink enough fluids to keep your pee (urine) clear or pale yellow.  Avoid tea, drinks with caffeine, and bubbly (carbonated) drinks.  Pee often. Avoid holding your pee in for a long time.  Pee before and after having sex (intercourse).  Wipe from front to back after you poop (bowel movement) if you are a woman. Use each tissue only once. GET HELP RIGHT AWAY IF:   You have back pain.  You have lower belly (abdominal) pain.  You have chills.  You feel sick to your stomach (nauseous).  You throw up (vomit).  Your burning or discomfort with peeing does not go away.  You have a fever.  Your symptoms are not better in 3 days. MAKE SURE YOU:   Understand these instructions.  Will watch your condition.  Will get help right away if you are not doing well or get worse. Document Released: 11/04/2007 Document Revised: 02/10/2012 Document Reviewed: 12/17/2011 [facility] Patient Information 2015 [place], [place]. This information is not intended to replace advice given to you by your health care provider. Make sure you discuss any questions you have with your health care provider.   You have a urinary tract infection. Increase fluids. Antibiotic twice a day. Also prescription for urinary pain.

## 2014-02-10 NOTE — ED Notes (Signed)
Pt waiting for pharm to bring medication. Pt aware

## 2014-03-08 ENCOUNTER — Encounter (HOSPITAL_COMMUNITY): Payer: Self-pay | Admitting: Emergency Medicine

## 2014-03-08 ENCOUNTER — Emergency Department (HOSPITAL_COMMUNITY): Payer: Medicaid Other

## 2014-03-08 ENCOUNTER — Emergency Department (HOSPITAL_COMMUNITY)
Admission: EM | Admit: 2014-03-08 | Discharge: 2014-03-08 | Disposition: A | Discharge status: Home / Self Care | Payer: Medicaid Other | Attending: Emergency Medicine | Admitting: Emergency Medicine

## 2014-03-08 DIAGNOSIS — Z8619 Personal history of other infectious and parasitic diseases: Secondary | ICD-10-CM | POA: Insufficient documentation

## 2014-03-08 DIAGNOSIS — Z7951 Long term (current) use of inhaled steroids: Secondary | ICD-10-CM | POA: Insufficient documentation

## 2014-03-08 DIAGNOSIS — Z3202 Encounter for pregnancy test, result negative: Secondary | ICD-10-CM | POA: Insufficient documentation

## 2014-03-08 DIAGNOSIS — R011 Cardiac murmur, unspecified: Secondary | ICD-10-CM | POA: Diagnosis not present

## 2014-03-08 DIAGNOSIS — R42 Dizziness and giddiness: Secondary | ICD-10-CM

## 2014-03-08 DIAGNOSIS — H538 Other visual disturbances: Secondary | ICD-10-CM | POA: Diagnosis not present

## 2014-03-08 DIAGNOSIS — H532 Diplopia: Secondary | ICD-10-CM | POA: Diagnosis not present

## 2014-03-08 LAB — BASIC METABOLIC PANEL
ANION GAP: 9 (ref 5–15)
BUN: 9 mg/dL (ref 6–23)
CO2: 28 meq/L (ref 19–32)
CREATININE: 0.85 mg/dL (ref 0.50–1.10)
Calcium: 8.8 mg/dL (ref 8.4–10.5)
Chloride: 104 mEq/L (ref 96–112)
GFR calc Af Amer: >90 mL/min (ref >90)
GFR calc non Af Amer: 88 mL/min — ABNORMAL LOW (ref >90)
Glucose, Bld: 79 mg/dL (ref 70–99)
POTASSIUM: 4.2 meq/L (ref 3.7–5.3)
Sodium: 141 mEq/L (ref 137–147)

## 2014-03-08 LAB — CBC WITH DIFFERENTIAL/PLATELET
BASOS ABS: 0 10*3/uL (ref 0.0–0.1)
BASOS PCT: 0 % (ref 0–1)
Eosinophils Absolute: 0.1 10*3/uL (ref 0.0–0.7)
Eosinophils Relative: 1 % (ref 0–5)
HCT: 33.4 % — ABNORMAL LOW (ref 36.0–46.0)
Hemoglobin: 10.9 g/dL — ABNORMAL LOW (ref 12.0–15.0)
Lymphocytes Relative: 25 % (ref 12–46)
Lymphs Abs: 1.5 10*3/uL (ref 0.7–4.0)
MCH: 28.8 pg (ref 26.0–34.0)
MCHC: 32.6 g/dL (ref 30.0–36.0)
MCV: 88.1 fL (ref 78.0–100.0)
MONOS PCT: 10 % (ref 3–12)
Monocytes Absolute: 0.5 10*3/uL (ref 0.1–1.0)
NEUTROS ABS: 3.6 10*3/uL (ref 1.7–7.7)
NEUTROS PCT: 64 % (ref 43–77)
Platelets: 325 10*3/uL (ref 150–400)
RBC: 3.79 MIL/uL — ABNORMAL LOW (ref 3.87–5.11)
RDW: 12.8 % (ref 11.5–15.5)
WBC: 5.7 10*3/uL (ref 4.0–10.5)

## 2014-03-08 LAB — URINALYSIS, ROUTINE W REFLEX MICROSCOPIC
Bilirubin Urine: NEGATIVE
Glucose, UA: NEGATIVE mg/dL
Hgb urine dipstick: NEGATIVE
Ketones, ur: NEGATIVE mg/dL
Leukocytes, UA: NEGATIVE
Nitrite: NEGATIVE
PH: 7.5 (ref 5.0–8.0)
Protein, ur: NEGATIVE mg/dL
SPECIFIC GRAVITY, URINE: 1.01 (ref 1.005–1.030)
Urobilinogen, UA: 1 mg/dL (ref 0.0–1.0)

## 2014-03-08 LAB — POC URINE PREG, ED: Preg Test, Ur: NEGATIVE

## 2014-03-08 MED ORDER — FLUTICASONE PROPIONATE 50 MCG/ACT NA SUSP: 2.0000 | Freq: Every day | NASAL | Status: DC

## 2014-03-08 MED ORDER — SODIUM CHLORIDE 0.9 % IV BOLUS (SEPSIS)
1000.0000 mL | Freq: Once | INTRAVENOUS | Status: AC
  Administered 2014-03-08: 1000 mL via INTRAVENOUS

## 2014-03-08 MED ORDER — SALINE SPRAY 0.65 % NA SOLN: 1.0000 | NASAL | Status: DC | PRN

## 2014-03-08 NOTE — ED Notes (Signed)
Gave patient ginger ale to drink as requested and approved by MD.

## 2014-03-08 NOTE — Discharge Instructions (Signed)

## 2014-03-08 NOTE — ED Notes (Signed)
Patient ambulated with no assistance or difficulty. Dr Dina Rich advised.

## 2014-03-08 NOTE — ED Provider Notes (Signed)
CSN: [id number]     Arrival date & time 03/08/14  0945 History  This chart was scribed for Merryl Hacker, MD by Zola Button, ED Scribe. This patient was seen in room APA17/APA17 and the patient's care was started at 10:28 AM.     Chief Complaint  Patient presents with  . Blurred Vision     HPI HPI Comments: Cynthia Molina is a 33 y.o. female who presents to the Emergency Department complaining of gradual onset, constant blurred vision that began 1 hour ago. She reports having vertical double vision that is not getting better with closing one eye. Patient states that dim lighting makes it better. She notes associated dizzy spells and lightheadedness. She states that over the last 2 weeks she has had multiple episodes as feeling lightheaded when she goes from sitting to standing.  She reports episodes of blurry vision that has self corrected. She denies any injury or getting anything in her eyes.  She denies room spinning dizziness. Patient denies HA, abdominal pain, nausea, vomiting, and recent illnesses. Her LNMP was 02/26/14 and does not rule out the possibility of pregnancy. She states that she has been hydrated and does not believe she has dehydration.   Past Medical History  Diagnosis Date  . Heart murmur   . Hx of trichomoniasis   . HPV (human papilloma virus) infection    Past Surgical History  Procedure Laterality Date  . Cesarean section    . Elective abortion     No family history on file. History  Substance Use Topics  . Smoking status: Never Smoker   . Smokeless tobacco: Never Used  . Alcohol Use: No   OB History   Grav Para Term Preterm Abortions TAB SAB Ect Mult Living   5 1   2  2         Review of Systems  Constitutional: Negative for fever.  Eyes: Positive for visual disturbance.  Respiratory: Negative for chest tightness and shortness of breath.   Cardiovascular: Negative for chest pain.  Gastrointestinal: Negative for nausea, vomiting and abdominal  pain.  Genitourinary: Negative for dysuria.  Musculoskeletal: Negative for neck pain.  Neurological: Positive for dizziness. Negative for weakness, numbness and headaches.  Psychiatric/Behavioral: Negative for confusion.  All other systems reviewed and are negative.     Allergies  Compazine  Home Medications   Prior to Admission medications   Medication Sig Start Date End Date Taking? Authorizing Provider  fluticasone (FLONASE) 50 MCG/ACT nasal spray Place 2 sprays into both nostrils daily. 03/08/14   Merryl Hacker, MD  sodium chloride (OCEAN) 0.65 % SOLN nasal spray Place 1 spray into both nostrils as needed for congestion. 03/08/14   Merryl Hacker, MD   BP 116/69  Pulse 61  Temp(Src) 98.3 F (36.8 C) (Oral)  Resp 12  Ht 5\' 6"  (1.676 m)  Wt 180 lb (81.647 kg)  BMI 29.07 kg/m2  SpO2 100%  LMP 02/26/2014 Physical Exam  Nursing note and vitals reviewed. Constitutional: She is oriented to person, place, and time. She appears well-developed and well-nourished. No distress.  HENT:  Head: Normocephalic and atraumatic.  Eyes: Conjunctivae and EOM are normal. Pupils are equal, round, and reactive to light.  Neck: Neck supple.  Cardiovascular: Normal rate, regular rhythm and normal heart sounds.   Pulmonary/Chest: Effort normal and breath sounds normal. No respiratory distress. She has no wheezes.  Abdominal: Soft. Bowel sounds are normal. There is no tenderness. There is no rebound.  Neurological: She is alert and oriented to person, place, and time.  Cranial nerves II through XII intact, visual fields intact, vision 20/20 out of both eyes, 5 out of 5 strength in all 4 extremities, no dysmetria to finger-nose-finger  Skin: Skin is warm and dry.  Psychiatric: She has a normal mood and affect.    ED Course  Procedures  DIAGNOSTIC STUDIES: Oxygen Saturation is 99% on RA, nml by my interpretation.    COORDINATION OF CARE: 10:31 AM-Discussed treatment plan which includes  labs, pregnancy test, and visual acuity screening with pt at bedside and pt agreed to plan.   Labs Review Labs Reviewed  CBC WITH DIFFERENTIAL - Abnormal; Notable for the following:    RBC 3.79 (*)    Hemoglobin 10.9 (*)    HCT 33.4 (*)    All other components within normal limits  BASIC METABOLIC PANEL - Abnormal; Notable for the following:    GFR calc non Af Amer 88 (*)    All other components within normal limits  URINALYSIS, ROUTINE W REFLEX MICROSCOPIC  POC URINE PREG, ED    Imaging Review Ct Head Wo Contrast  03/08/2014   CLINICAL DATA:  Blurred vision with diplopia; bilateral facial numbness of acute onset  EXAM: CT HEAD WITHOUT CONTRAST  TECHNIQUE: Contiguous axial images were obtained from the base of the skull through the vertex without intravenous contrast.  COMPARISON:  Oct 01, 2007  FINDINGS: The ventricles are normal in size and configuration. There is no mass, hemorrhage, extra-axial fluid collection, or midline shift. Gray-white compartments are normal. No acute infarct apparent. Bony calvarium appears intact. Mastoid air cells are clear on the right. There is mild opacification of several inferior mastoid air cells on the left, stable. Elsewhere mastoids on the left are clear. No intraorbital lesions are apparent.  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 Other mastoids are clear. No intracranial mass, hemorrhage, or focal gray-white compartment lesions/acute infarct.   Electronically Signed   By: Lowella Grip M.D.   On: 03/08/2014 13:20     EKG Interpretation None      MDM   Final diagnoses:  Lightheadedness  Blurry vision, bilateral   Patient presents with blurry vision associated with multiple episodes of lightheadedness over the last several weeks. She is nontoxic and nonfocal on exam. Visual acuity is intact as are visual fields. History not suggestive of injury or corneal abrasion. Patient does not work contacts. No  evidence of orthostasis. Basic labwork obtained and there is no evidence of dehydration or anemia.  Given the good explanation for the patient's symptoms, CT head obtained and shows no evidence of a mass occupying lesion. However this would not be sensitive for pituitary lesion or adenoma. This showed opacification of the inferior mastoids which is stable. On reexam, patient reports complete improvement of her symptoms. At this time etiology is unknown. . She is to followup with her primary physician if symptoms recur or persist.  After history, exam, and medical workup I feel the patient has been appropriately medically screened and is safe for discharge home. Pertinent diagnoses were discussed with the patient. Patient was given return precautions.  I personally performed the services described in this documentation, which was scribed in my presence. The recorded information has been reviewed and is accurate.    Merryl Hacker, MD 03/10/14 [phone]

## 2014-03-08 NOTE — ED Notes (Signed)
PT was at work this morning and stated all of a sudden her vision blurred and doubled. PT denies any headache, n/v. PT deneis any recent medication changes. PT also c/o numbness to finger tips and bilateral sides of face.

## 2014-04-02 ENCOUNTER — Encounter (HOSPITAL_COMMUNITY): Payer: Self-pay | Admitting: Emergency Medicine

## 2014-07-23 ENCOUNTER — Emergency Department (HOSPITAL_COMMUNITY)
Admission: EM | Admit: 2014-07-23 | Discharge: 2014-07-23 | Disposition: A | Discharge status: Home / Self Care | Payer: Medicaid Other | Attending: Emergency Medicine | Admitting: Emergency Medicine

## 2014-07-23 ENCOUNTER — Encounter (HOSPITAL_COMMUNITY): Payer: Self-pay | Admitting: Emergency Medicine

## 2014-07-23 DIAGNOSIS — Z8619 Personal history of other infectious and parasitic diseases: Secondary | ICD-10-CM | POA: Diagnosis not present

## 2014-07-23 DIAGNOSIS — R443 Hallucinations, unspecified: Secondary | ICD-10-CM | POA: Diagnosis present

## 2014-07-23 DIAGNOSIS — Z79899 Other long term (current) drug therapy: Secondary | ICD-10-CM | POA: Diagnosis not present

## 2014-07-23 DIAGNOSIS — R011 Cardiac murmur, unspecified: Secondary | ICD-10-CM | POA: Insufficient documentation

## 2014-07-23 DIAGNOSIS — Z7951 Long term (current) use of inhaled steroids: Secondary | ICD-10-CM | POA: Diagnosis not present

## 2014-07-23 LAB — COMPREHENSIVE METABOLIC PANEL
ALT: 14 U/L (ref 0–35)
AST: 16 U/L (ref 0–37)
Albumin: 4.2 g/dL (ref 3.5–5.2)
Alkaline Phosphatase: 67 U/L (ref 39–117)
Anion gap: 6 (ref 5–15)
BILIRUBIN TOTAL: 0.5 mg/dL (ref 0.3–1.2)
BUN: 7 mg/dL (ref 6–23)
CALCIUM: 9.1 mg/dL (ref 8.4–10.5)
CO2: 29 mmol/L (ref 19–32)
CREATININE: 0.68 mg/dL (ref 0.50–1.10)
Chloride: 103 mmol/L (ref 96–112)
GFR calc non Af Amer: >90 mL/min (ref >90)
GLUCOSE: 141 mg/dL — AB (ref 70–99)
Potassium: 3.4 mmol/L — ABNORMAL LOW (ref 3.5–5.1)
SODIUM: 138 mmol/L (ref 135–145)
Total Protein: 8.2 g/dL (ref 6.0–8.3)

## 2014-07-23 LAB — RAPID URINE DRUG SCREEN, HOSP PERFORMED
Amphetamines: NOT DETECTED
BENZODIAZEPINES: NOT DETECTED
Barbiturates: NOT DETECTED
COCAINE: NOT DETECTED
Opiates: NOT DETECTED
TETRAHYDROCANNABINOL: NOT DETECTED

## 2014-07-23 LAB — CBC
HCT: 38.5 % (ref 36.0–46.0)
HEMOGLOBIN: 12.2 g/dL (ref 12.0–15.0)
MCH: 28.4 pg (ref 26.0–34.0)
MCHC: 31.7 g/dL (ref 30.0–36.0)
MCV: 89.7 fL (ref 78.0–100.0)
Platelets: 332 10*3/uL (ref 150–400)
RBC: 4.29 MIL/uL (ref 3.87–5.11)
RDW: 13 % (ref 11.5–15.5)
WBC: 9.4 10*3/uL (ref 4.0–10.5)

## 2014-07-23 LAB — SALICYLATE LEVEL: Salicylate Lvl: <4 mg/dL (ref 2.8–20.0)

## 2014-07-23 LAB — CARBAMAZEPINE LEVEL, TOTAL: Carbamazepine Lvl: <2 ug/mL — ABNORMAL LOW (ref 4.0–12.0)

## 2014-07-23 LAB — ACETAMINOPHEN LEVEL: Acetaminophen (Tylenol), Serum: <10 ug/mL — ABNORMAL LOW (ref 10–30)

## 2014-07-23 LAB — ETHANOL

## 2014-07-23 MED ORDER — LORAZEPAM 1 MG PO TABS: 1.0000 mg | ORAL_TABLET | Freq: Three times a day (TID) | ORAL | Status: DC | PRN

## 2014-07-23 MED ORDER — LORAZEPAM 1 MG PO TABS: 1.0000 mg | ORAL_TABLET | Freq: Every day | ORAL | Status: DC

## 2014-07-23 MED ORDER — ACETAMINOPHEN 325 MG PO TABS: 650.0000 mg | ORAL_TABLET | ORAL | Status: DC | PRN

## 2014-07-23 MED ORDER — ONDANSETRON HCL 4 MG PO TABS
4.0000 mg | ORAL_TABLET | Freq: Three times a day (TID) | ORAL | Status: DC | PRN
Start: 2014-07-23 — End: 2014-07-24

## 2014-07-23 NOTE — ED Provider Notes (Signed)
CSN: [id number]     Arrival date & time 07/23/14  1729 History  This chart was scribed for Cynthia Lange, PA-C, working with Ovid Curd R. Alvino Chapel, MD by Steva Colder, ED Scribe. The patient was seen in room WTR7/WTR7 at 8:52 PM.    Chief Complaint  Patient presents with  . Hallucinations      The history is provided by the patient. No language interpreter was used.    HPI Comments: Cynthia Molina is a 34 y.o. female who presents to the Emergency Department complaining of auditory and visual intermittent hallucinations onset x 2 weeks. Pt was sent to the ED by therapist at [facility] because of a reaction to a medication. Pt is hearing and seeing things. Pt saw "blasphemy" while texting when it wasn't really there. Pt thinks that satan is out to get her because she keeps seeing the word blasphemy. Pt hears her husband talking to her when he isn't. Pt is not hearing command voices. Pt began trileptal 150 mg for treatment of panic attacks and PTSD at the end of Jul 31, 2022 because of death and stress in the family. Pt therapist didn't recommend her to d/c the medications because her PCP is sick. Pt therapist wants the ED to find out what the issue is. Pt has not had hallucinations prior to the medications. She denies fever, cough, vomiting, SOB, SI, HI, and any other symptoms.   Past Medical History  Diagnosis Date  . Heart murmur   . Hx of trichomoniasis   . HPV (human papilloma virus) infection    Past Surgical History  Procedure Laterality Date  . Cesarean section    . Elective abortion     History reviewed. No pertinent family history. History  Substance Use Topics  . Smoking status: Never Smoker   . Smokeless tobacco: Never Used  . Alcohol Use: No   OB History    Gravida Para Term Preterm AB TAB SAB Ectopic Multiple Living   5 1   2  2         Review of Systems  Constitutional: Negative for fever.  Respiratory: Negative for cough and shortness of breath.   Gastrointestinal:  Negative for vomiting.  Psychiatric/Behavioral: Positive for hallucinations. Negative for suicidal ideas.      Allergies  Compazine  Home Medications   Prior to Admission medications   Medication Sig Start Date End Date Taking? Authorizing Provider  hydrOXYzine (ATARAX/VISTARIL) 25 MG tablet Take 25 mg by mouth 3 (three) times daily as needed for anxiety or itching.   Yes Historical Provider, MD  OXcarbazepine (TRILEPTAL) 150 MG tablet Take 150 mg by mouth 2 (two) times daily.   Yes Historical Provider, MD  sertraline (ZOLOFT) 100 MG tablet Take 100 mg by mouth daily.   Yes Historical Provider, MD  fluticasone (FLONASE) 50 MCG/ACT nasal spray Place 2 sprays into both nostrils daily. Patient not taking: Reported on 07/23/2014 03/08/14   Merryl Hacker, MD  sodium chloride (OCEAN) 0.65 % SOLN nasal spray Place 1 spray into both nostrils as needed for congestion. Patient not taking: Reported on 07/23/2014 03/08/14   Merryl Hacker, MD   BP 117/63 mmHg  Pulse 59  Temp(Src) 98.9 F (37.2 C) (Oral)  Resp 18  SpO2 98%  Physical Exam  Constitutional: She is oriented to person, place, and time. She appears well-developed and well-nourished. No distress.  HENT:  Head: Normocephalic and atraumatic.  Eyes: EOM are normal.  Neck: Neck supple. No tracheal deviation present.  Cardiovascular: Normal rate, regular rhythm and normal heart sounds.  Exam reveals no gallop.   No murmur heard. Pulmonary/Chest: Effort normal and breath sounds normal. No respiratory distress. She has no wheezes. She has no rales.  Musculoskeletal: Normal range of motion.  Neurological: She is alert and oriented to person, place, and time.  Skin: Skin is warm and dry.  Psychiatric: She has a normal mood and affect. Her behavior is normal.  Nursing note and vitals reviewed.   ED Course  Procedures (including critical care time) DIAGNOSTIC STUDIES: Oxygen Saturation is 98% on RA, normal by my interpretation.     COORDINATION OF CARE: 8:57 PM-Discussed treatment plan which includes UA, CBC, CMAT with pt at bedside and pt agreed to plan.   Labs Review Labs Reviewed  ACETAMINOPHEN LEVEL - Abnormal; Notable for the following:    Acetaminophen (Tylenol), Serum <10.0 (*)    All other components within normal limits  COMPREHENSIVE METABOLIC PANEL - Abnormal; Notable for the following:    Potassium 3.4 (*)    Glucose, Bld 141 (*)    All other components within normal limits  CBC  ETHANOL  SALICYLATE LEVEL  URINE RAPID DRUG SCREEN (HOSP PERFORMED)   Results for orders placed or performed during the hospital encounter of 07/23/14  Acetaminophen level  Result Value Ref Range   Acetaminophen (Tylenol), Serum <10.0 (L) 10 - 30 ug/mL  CBC  Result Value Ref Range   WBC 9.4 4.0 - 10.5 K/uL   RBC 4.29 3.87 - 5.11 MIL/uL   Hemoglobin 12.2 12.0 - 15.0 g/dL   HCT 38.5 36.0 - 46.0 %   MCV 89.7 78.0 - 100.0 fL   MCH 28.4 26.0 - 34.0 pg   MCHC 31.7 30.0 - 36.0 g/dL   RDW 13.0 11.5 - 15.5 %   Platelets 332 150 - 400 K/uL  Comprehensive metabolic panel  Result Value Ref Range   Sodium 138 135 - 145 mmol/L   Potassium 3.4 (L) 3.5 - 5.1 mmol/L   Chloride 103 96 - 112 mmol/L   CO2 29 19 - 32 mmol/L   Glucose, Bld 141 (H) 70 - 99 mg/dL   BUN 7 6 - 23 mg/dL   Creatinine, Ser 0.68 0.50 - 1.10 mg/dL   Calcium 9.1 8.4 - 10.5 mg/dL   Total Protein 8.2 6.0 - 8.3 g/dL   Albumin 4.2 3.5 - 5.2 g/dL   AST 16 0 - 37 U/L   ALT 14 0 - 35 U/L   Alkaline Phosphatase 67 39 - 117 U/L   Total Bilirubin 0.5 0.3 - 1.2 mg/dL   GFR calc non Af Amer >90 >90 mL/min   GFR calc Af Amer >90 >90 mL/min   Anion gap 6 5 - 15  Ethanol (ETOH)  Result Value Ref Range   Alcohol, Ethyl (B) <5 0 - 9 mg/dL  Salicylate level  Result Value Ref Range   Salicylate Lvl <2.2 2.8 - 20.0 mg/dL  Urine Drug Screen  Result Value Ref Range   Opiates NONE DETECTED NONE DETECTED   Cocaine NONE DETECTED NONE DETECTED   Benzodiazepines NONE  DETECTED NONE DETECTED   Amphetamines NONE DETECTED NONE DETECTED   Tetrahydrocannabinol NONE DETECTED NONE DETECTED   Barbiturates NONE DETECTED NONE DETECTED    Imaging Review No results found.   EKG Interpretation None      MDM   Final diagnoses:  None    1. Hallucinations  She will require evaluation by psychiatry for hallucinations (new  onset). No SI/HI. Auditory hallucinations are not command voices. She feels "satan is trying to possess her body". Will have TTS consult for further evaluation.  10:15:  The patient has decided against staying for evaluation of hallucinations. She continues to deny suicidal or homicidal thoughts. Husband is at bedside and feels she is safe at home. He reports he will be home with her for the next several days. She has community follow up in place. She is instructed to stop taking Trileptal and hydroxycine and is given Ativan for sleep (#5 only). She is felt safe for discharge.   I personally performed the services described in this documentation, which was scribed in my presence. The recorded information has been reviewed and is accurate.     Dewaine Oats, PA-C 07/23/14 2108  Dewaine Oats, PA-C 07/23/14 2228  Jasper Riling. Alvino Chapel, MD 07/24/14 [phone]

## 2014-07-23 NOTE — Discharge Instructions (Signed)
FOLLOW UP WITH YOUR DOCTOR TOMORROW. RETURN TO THE EMERGENCY DEPARTMENT ANY TIME YOU FEEL YOU NEED FURTHER HELP.

## 2014-07-23 NOTE — ED Notes (Signed)
Pt refusing to part with belongings, and does not want her husband to leave. Process explained to pt and husband, however pt is still upset about the process at this time. PA and RN made aware that pt is requesting to leave. PA states this is fine, as she has no intentions to IVC her.

## 2014-07-23 NOTE — ED Notes (Signed)
Pt sent to ED by therapist at [facility] because of reaction to medication. Patient appears disoriented, states "I do not know why I am here." Pt's family member able to provide some information. Denies SI/HI. Pt c/o auditory and visual hallucinations. States no voices are telling her to do anything.

## 2014-07-23 NOTE — ED Notes (Signed)
Patient refusing to stay without husband. Denies SI/HI. Instructed to come back if she had any thoughts wanting to hurt herself or others. Alert and oriented upon leaving.

## 2014-07-23 NOTE — BHH Counselor (Addendum)
TTS Counselor spoke with [name], PA-C about pt leaving ED before behavioral health assessment. Nehemiah Settle stated that pt wanted to leave and that she and attending MD did not feel that pt was a danger to self or others. Pt has left the hospital and was told to return if she experiences SI/HI. Nehemiah Settle reports that she has taken TTS consult out.  Ramond Dial, [facility] Triage Specialist

## 2014-10-10 ENCOUNTER — Ambulatory Visit (HOSPITAL_BASED_OUTPATIENT_CLINIC_OR_DEPARTMENT_OTHER): Payer: Medicaid Other

## 2014-10-17 ENCOUNTER — Ambulatory Visit (INDEPENDENT_AMBULATORY_CARE_PROVIDER_SITE_OTHER): Payer: Medicaid Other | Admitting: Obstetrics & Gynecology

## 2014-10-17 ENCOUNTER — Encounter: Payer: Self-pay | Admitting: Obstetrics & Gynecology

## 2014-10-17 VITALS — BP 108/60 | HR 72 | Ht 66.0 in | Wt 184.0 lb

## 2014-10-17 DIAGNOSIS — Z1389 Encounter for screening for other disorder: Secondary | ICD-10-CM | POA: Diagnosis not present

## 2014-10-17 DIAGNOSIS — N946 Dysmenorrhea, unspecified: Secondary | ICD-10-CM | POA: Diagnosis not present

## 2014-10-17 LAB — POCT URINALYSIS DIPSTICK
GLUCOSE UA: NEGATIVE
Ketones, UA: NEGATIVE
Leukocytes, UA: NEGATIVE
Nitrite, UA: NEGATIVE
Protein, UA: NEGATIVE
RBC UA: NEGATIVE

## 2014-10-17 MED ORDER — PIROXICAM 20 MG PO CAPS: 20.0000 mg | ORAL_CAPSULE | Freq: Every day | ORAL | Status: DC

## 2014-10-17 MED ORDER — DESOGESTREL-ETHINYL ESTRADIOL 0.15-30 MG-MCG PO TABS: 1.0000 | ORAL_TABLET | Freq: Every day | ORAL | Status: DC

## 2014-10-17 NOTE — Addendum Note (Signed)
Addended by: Doyne Keel on: 10/17/2014 04:12 PM   Modules accepted: Orders

## 2014-10-17 NOTE — Progress Notes (Signed)
Patient ID: Cynthia Molina, female   DOB: 08-27-1980, 34 y.o.   MRN: [id number]   Chief Complaint  Patient presents with  . gyn visit    soreness at old incision area, during period.think has uti.     HPI:    34 y.o. [id number] Patient's last menstrual period was 10/05/2014.  Pt has been having severe pain just before starts her menses, gets better when she bleeds Location:  Pelvic, midline. Quality:  Like a tooth ache Severity:  severe. Timing:  2 days before menses. Duration:  2 days. Context:  jsut before period. Modifying factors:  none Signs/Symptoms:  none    Current outpatient prescriptions:  .  benztropine (COGENTIN) 1 MG tablet, Take 1 mg by mouth 2 (two) times daily., Disp: , Rfl:  .  hydrOXYzine (ATARAX/VISTARIL) 50 MG tablet, Take 50 mg by mouth 3 (three) times daily as needed., Disp: , Rfl:  .  QUEtiapine (SEROQUEL) 200 MG tablet, Take 200 mg by mouth at bedtime., Disp: , Rfl:  .  sertraline (ZOLOFT) 100 MG tablet, Take 100 mg by mouth daily., Disp: , Rfl:  .  fluticasone (FLONASE) 50 MCG/ACT nasal spray, Place 2 sprays into both nostrils daily. (Patient not taking: Reported on 07/23/2014), Disp: 16 g, Rfl: 2 .  LORazepam (ATIVAN) 1 MG tablet, Take 1 tablet (1 mg total) by mouth at bedtime. (Patient not taking: Reported on 10/17/2014), Disp: 5 tablet, Rfl: 0 .  sodium chloride (OCEAN) 0.65 % SOLN nasal spray, Place 1 spray into both nostrils as needed for congestion. (Patient not taking: Reported on 07/23/2014), Disp: 30 mL, Rfl: 0 .  [DISCONTINUED] OXcarbazepine (TRILEPTAL) 150 MG tablet, Take 150 mg by mouth 2 (two) times daily., Disp: , Rfl:   Problem Pertinent ROS:       No dyspareunia  No burning with urination, frequency or urgency No nausea, vomiting or diarrhea Nor fever chills or other constitutional symptoms   Extended ROS:        [place]:             Past Medical History  Diagnosis Date  . Heart murmur   . Hx of trichomoniasis   . HPV (human  papilloma virus) infection   . Anxiety   . Depression     Past Surgical History  Procedure Laterality Date  . Cesarean section    . Elective abortion      OB History    Gravida Para Term Preterm AB TAB SAB Ectopic Multiple Living   5 1   2  2          Allergies  Allergen Reactions  . Compazine [Prochlorperazine Edisylate] Other (See Comments)    "jittery"    History   Social History  . Marital Status: Married    Spouse Name: N/A  . Number of Children: N/A  . Years of Education: N/A   Social History Main Topics  . Smoking status: Never Smoker   . Smokeless tobacco: Never Used  . Alcohol Use: No  . Drug Use: No  . Sexual Activity: Yes    Birth Control/ Protection: None   Other Topics Concern  . Not on file   Social History Narrative    No family history on file.   Examination:  Vitals:  Blood pressure 108/60, pulse 72, height 5\' 6"  (1.676 m), weight 184 lb (83.462 kg), last menstrual period 10/05/2014.    Physical Examination:     General Appearance:  awake, alert, oriented, in no acute  distress  Vulva:  normal appearing vulva with no masses, tenderness or lesions Vagina:  normal mucosa, no discharge Cervix:  no cervical motion tenderness and no lesions Uterus:  normal size, contour, position, consistency, mobility, non-tender Adnexa: ovaries:present,  normal adnexa in size, nontender and no masses     DATA orders and reviews: Labs were not ordered today:   Imaging studies were not ordered today:    Lab tests were not reviewed today:    Imaging studies were not reviewed today:    I did not independently review/view images, tracing or specimen(not simply the report) myself.  Prescription Drug Management:  New Prescriptions: Desogestrel OCP and feldene Renewed Prescriptions:   Current prescription changes:     Impression/Plan(Problem Based): 1.  Dysmenorrhea      (new problem) : Additional workup is not needed:  OCP + feldene     Follow  Up:   4  months

## 2014-10-18 ENCOUNTER — Ambulatory Visit (HOSPITAL_BASED_OUTPATIENT_CLINIC_OR_DEPARTMENT_OTHER): Payer: Medicaid Other | Attending: Family Medicine | Admitting: Radiology

## 2014-10-18 VITALS — Ht 66.0 in | Wt 170.0 lb

## 2014-10-18 DIAGNOSIS — G4730 Sleep apnea, unspecified: Secondary | ICD-10-CM | POA: Insufficient documentation

## 2014-10-18 DIAGNOSIS — G4763 Sleep related bruxism: Secondary | ICD-10-CM | POA: Insufficient documentation

## 2014-10-18 DIAGNOSIS — G4700 Insomnia, unspecified: Secondary | ICD-10-CM | POA: Diagnosis present

## 2014-10-18 DIAGNOSIS — G479 Sleep disorder, unspecified: Secondary | ICD-10-CM

## 2014-10-24 ENCOUNTER — Encounter (HOSPITAL_COMMUNITY): Payer: Self-pay | Admitting: *Deleted

## 2014-10-24 ENCOUNTER — Emergency Department (HOSPITAL_COMMUNITY)
Admission: EM | Admit: 2014-10-24 | Discharge: 2014-10-24 | Disposition: A | Discharge status: Home / Self Care | Payer: Medicaid Other | Attending: Emergency Medicine | Admitting: Emergency Medicine

## 2014-10-24 DIAGNOSIS — Z8619 Personal history of other infectious and parasitic diseases: Secondary | ICD-10-CM | POA: Diagnosis not present

## 2014-10-24 DIAGNOSIS — F419 Anxiety disorder, unspecified: Secondary | ICD-10-CM | POA: Diagnosis not present

## 2014-10-24 DIAGNOSIS — R011 Cardiac murmur, unspecified: Secondary | ICD-10-CM | POA: Insufficient documentation

## 2014-10-24 DIAGNOSIS — R8299 Other abnormal findings in urine: Secondary | ICD-10-CM | POA: Diagnosis present

## 2014-10-24 DIAGNOSIS — Z791 Long term (current) use of non-steroidal anti-inflammatories (NSAID): Secondary | ICD-10-CM | POA: Insufficient documentation

## 2014-10-24 DIAGNOSIS — Z79899 Other long term (current) drug therapy: Secondary | ICD-10-CM | POA: Insufficient documentation

## 2014-10-24 DIAGNOSIS — F329 Major depressive disorder, single episode, unspecified: Secondary | ICD-10-CM | POA: Insufficient documentation

## 2014-10-24 DIAGNOSIS — Z7951 Long term (current) use of inhaled steroids: Secondary | ICD-10-CM | POA: Diagnosis not present

## 2014-10-24 DIAGNOSIS — Z3202 Encounter for pregnancy test, result negative: Secondary | ICD-10-CM | POA: Diagnosis not present

## 2014-10-24 DIAGNOSIS — N3000 Acute cystitis without hematuria: Secondary | ICD-10-CM | POA: Insufficient documentation

## 2014-10-24 LAB — URINALYSIS, ROUTINE W REFLEX MICROSCOPIC
Bilirubin Urine: NEGATIVE
GLUCOSE, UA: NEGATIVE mg/dL
Hgb urine dipstick: NEGATIVE
KETONES UR: NEGATIVE mg/dL
Nitrite: POSITIVE — AB
Protein, ur: NEGATIVE mg/dL
SPECIFIC GRAVITY, URINE: 1.015 (ref 1.005–1.030)
Urobilinogen, UA: 0.2 mg/dL (ref 0.0–1.0)
pH: 6 (ref 5.0–8.0)

## 2014-10-24 LAB — URINE MICROSCOPIC-ADD ON

## 2014-10-24 LAB — PREGNANCY, URINE: Preg Test, Ur: NEGATIVE

## 2014-10-24 MED ORDER — NITROFURANTOIN MONOHYD MACRO 100 MG PO CAPS
100.0000 mg | ORAL_CAPSULE | Freq: Once | ORAL | Status: AC
  Administered 2014-10-24: 100 mg via ORAL
  Filled 2014-10-24: qty 1

## 2014-10-24 MED ORDER — NITROFURANTOIN MONOHYD MACRO 100 MG PO CAPS: 100.0000 mg | ORAL_CAPSULE | Freq: Two times a day (BID) | ORAL | Status: DC

## 2014-10-24 NOTE — Discharge Instructions (Signed)

## 2014-10-24 NOTE — ED Provider Notes (Signed)
CSN: [id number]     Arrival date & time 10/24/14  0531 History   First MD Initiated Contact with Patient 10/24/14 [phone]     Chief Complaint  Patient presents with  . Abnormal Urinary Odor      (Consider location/radiation/quality/duration/timing/severity/associated sxs/prior Treatment) HPI  This is a 34 year old female with a two-month history of an abnormal odor to her urine. She states it smells like ammonia. If she drinks a lot of water the odor is not present. She was seen by her PCP 5 days ago and had a urinalysis that was unremarkable. She denies dysuria, vaginal bleeding or vaginal discharge. She denies abnormal vaginal odor. She has no abdominal pain, fever or chills.  Past Medical History  Diagnosis Date  . Heart murmur   . Hx of trichomoniasis   . HPV (human papilloma virus) infection   . Anxiety   . Depression    Past Surgical History  Procedure Laterality Date  . Cesarean section    . Elective abortion     No family history on file. History  Substance Use Topics  . Smoking status: Never Smoker   . Smokeless tobacco: Never Used  . Alcohol Use: No   OB History    Gravida Para Term Preterm AB TAB SAB Ectopic Multiple Living   5 1   2  2         Review of Systems  All other systems reviewed and are negative.   Allergies  Compazine  Home Medications   Prior to Admission medications   Medication Sig Start Date End Date Taking? Authorizing Provider  benztropine (COGENTIN) 1 MG tablet Take 1 mg by mouth 2 (two) times daily.    Historical Provider, MD  desogestrel-ethinyl estradiol (APRI,EMOQUETTE,SOLIA) 0.15-30 MG-MCG tablet Take 1 tablet by mouth daily. 10/17/14   Florian Buff, MD  fluticasone (FLONASE) 50 MCG/ACT nasal spray Place 2 sprays into both nostrils daily. Patient not taking: Reported on 07/23/2014 03/08/14   Merryl Hacker, MD  hydrOXYzine (ATARAX/VISTARIL) 50 MG tablet Take 50 mg by mouth 3 (three) times daily as needed.    Historical Provider, MD   LORazepam (ATIVAN) 1 MG tablet Take 1 tablet (1 mg total) by mouth at bedtime. Patient not taking: Reported on 10/17/2014 07/23/14   [name], PA-C  piroxicam (FELDENE) 20 MG capsule Take 1 capsule (20 mg total) by mouth daily. 10/17/14   Florian Buff, MD  QUEtiapine (SEROQUEL) 200 MG tablet Take 200 mg by mouth at bedtime.    Historical Provider, MD  sertraline (ZOLOFT) 100 MG tablet Take 100 mg by mouth daily.    Historical Provider, MD  sodium chloride (OCEAN) 0.65 % SOLN nasal spray Place 1 spray into both nostrils as needed for congestion. Patient not taking: Reported on 07/23/2014 03/08/14   Merryl Hacker, MD   BP 120/79 mmHg  Pulse 87  Temp(Src) 98.3 F (36.8 C) (Oral)  Resp 18  Ht 5\' 6"  (1.676 m)  Wt 185 lb (83.915 kg)  BMI 29.87 kg/m2  SpO2 98%  LMP 10/05/2014   Physical Exam  General: Well-developed, well-nourished female in no acute distress; appearance consistent with age of record HENT: normocephalic; atraumatic Eyes: pupils equal, round and reactive to light; extraocular muscles intact Neck: supple Heart: regular rate and rhythm Lungs: clear to auscultation bilaterally Abdomen: soft; nondistended; nontender; no masses or hepatosplenomegaly; bowel sounds present Extremities: No deformity; full range of motion; pulses normal Neurologic: Awake, alert and oriented; motor function intact in  all extremities and symmetric; no facial droop Skin: Warm and dry Psychiatric: Normal mood and affect    ED Course  Procedures (including critical care time)   MDM   Nursing notes and vitals signs, including pulse oximetry, reviewed.  Summary of this visit's results, reviewed by myself:  Labs:  Results for orders placed or performed during the hospital encounter of 10/24/14 (from the past 24 hour(s))  Urinalysis, Routine w reflex microscopic     Status: Abnormal   Collection Time: 10/24/14  5:50 AM  Result Value Ref Range   Color, Urine YELLOW YELLOW   APPearance  CLEAR CLEAR   Specific Gravity, Urine 1.015 1.005 - 1.030   pH 6.0 5.0 - 8.0   Glucose, UA NEGATIVE NEGATIVE mg/dL   Hgb urine dipstick NEGATIVE NEGATIVE   Bilirubin Urine NEGATIVE NEGATIVE   Ketones, ur NEGATIVE NEGATIVE mg/dL   Protein, ur NEGATIVE NEGATIVE mg/dL   Urobilinogen, UA 0.2 0.0 - 1.0 mg/dL   Nitrite POSITIVE (A) NEGATIVE   Leukocytes, UA TRACE (A) NEGATIVE  Pregnancy, urine     Status: None   Collection Time: 10/24/14  5:50 AM  Result Value Ref Range   Preg Test, Ur NEGATIVE NEGATIVE  Urine microscopic-add on     Status: Abnormal   Collection Time: 10/24/14  5:50 AM  Result Value Ref Range   Squamous Epithelial / LPF FEW (A) RARE   WBC, UA 3-6 <3 WBC/hpf   Bacteria, UA MANY (A) RARE     Shanon Rosser, MD 10/24/14 [phone]

## 2014-10-24 NOTE — ED Notes (Signed)
Pt c/o urinary frequency, strong odor to urine that she started noticing two months ago, had urinalysis performed at pcp office Friday and was negative,

## 2014-10-26 LAB — URINE CULTURE

## 2014-10-27 ENCOUNTER — Ambulatory Visit (HOSPITAL_BASED_OUTPATIENT_CLINIC_OR_DEPARTMENT_OTHER): Payer: Medicaid Other | Admitting: Internal Medicine

## 2014-10-27 ENCOUNTER — Telehealth (HOSPITAL_BASED_OUTPATIENT_CLINIC_OR_DEPARTMENT_OTHER): Payer: Self-pay | Admitting: Emergency Medicine

## 2014-10-27 DIAGNOSIS — G479 Sleep disorder, unspecified: Secondary | ICD-10-CM | POA: Diagnosis not present

## 2014-10-27 NOTE — Telephone Encounter (Signed)
Post ED Visit - Positive Culture Follow-up  Culture report reviewed by antimicrobial stewardship pharmacist: []  [place], Pharm.D., BCPS [x]  Heide Guile, Pharm.D., BCPS []  Alycia Rossetti, Pharm.D., BCPS []  [place], [place].D., BCPS, AAHIVP []  Legrand [place], Pharm.D., BCPS, AAHIVP []  Isac Sarna, Pharm.D., BCPS  Positive urine  Culture E. coli Treated with nitrofurantoin, organism sensitive to the same and no further patient follow-up is required at this time.  Hazle Nordmann 10/27/2014, 9:23 AM

## 2014-10-27 NOTE — Sleep Study (Signed)
   NAME: Cynthia Molina DATE OF BIRTH:  19-Dec-1980 MEDICAL RECORD NUMBER [id number]  LOCATION: [facility]  PHYSICIAN: YOUNG,CLINTON D  DATE OF STUDY: 10/18/2014  SLEEP STUDY TYPE: Nocturnal Polysomnogram               REFERRING PHYSICIAN: Iona Beard, MD  INDICATION FOR STUDY: Insomnia with sleep apnea  EPWORTH SLEEPINESS SCORE:   14/24 HEIGHT: 5\' 6"  (167.6 cm)  WEIGHT: 170 lb (77.111 kg)    Body mass index is 27.45 kg/(m^2).  NECK SIZE: 13.5 in.  MEDICATIONS: Charted for review  SLEEP ARCHITECTURE: Total sleep time 166.5 minutes with sleep efficiency 42.7%. Stage I was 15.9%, stage II 84.1%, stage III and REM were absent. Sleep latency 55.5 minutes, awake after sleep onset 167.5 minutes, arousal index 17.3, bedtime medication: None  RESPIRATORY DATA: Apnea hypopnea index (AHI) 0.7 per hour. 2 events were identified as central apneas while supine.  OXYGEN DATA: No significant snoring noted by the technician. Oxygen desaturation to a nadir of 94% and mean saturation 97% on room air  CARDIAC DATA: Normal sinus rhythm  MOVEMENT/PARASOMNIA: No significant movement disturbance. Bathroom 1. One episode of apparent bruxism.  IMPRESSION/ RECOMMENDATION:   1) The patient described her home sleep problem as feeling panicky. On this study night sleep onset was shortly after 11 PM but then she was awake most of the night between 1 AM and 4 AM. Consider management as insomnia. 2) 1 episode of bruxism was noted 3) Only 2 respiratory events were recorded with sleep disturbance, both central apneas. AHI 0.7 per hour. The normal range for adults is an AHI from 0-5 events per hour. No significant snoring was noted. Oxygen desaturation to a nadir of 94% and mean saturation 97% on room air  Deneise Lever Diplomate, American Board of Sleep Medicine  ELECTRONICALLY SIGNED ON:  10/27/2014, 10:21 AM [place] PH: [phone]   FX: [phone]  [phone] [place]

## 2014-10-30 ENCOUNTER — Ambulatory Visit (INDEPENDENT_AMBULATORY_CARE_PROVIDER_SITE_OTHER): Payer: Medicaid Other | Admitting: Urology

## 2014-10-30 DIAGNOSIS — N390 Urinary tract infection, site not specified: Secondary | ICD-10-CM

## 2014-11-04 ENCOUNTER — Encounter (HOSPITAL_BASED_OUTPATIENT_CLINIC_OR_DEPARTMENT_OTHER): Payer: Medicaid Other

## 2014-12-25 ENCOUNTER — Other Ambulatory Visit: Payer: Self-pay | Admitting: Urology

## 2014-12-25 DIAGNOSIS — R8281 Pyuria: Secondary | ICD-10-CM

## 2014-12-27 ENCOUNTER — Ambulatory Visit (HOSPITAL_COMMUNITY)
Admission: RE | Admit: 2014-12-27 | Discharge: 2014-12-27 | Disposition: A | Discharge status: Home / Self Care | Payer: Medicaid Other | Source: Ambulatory Visit | Attending: Urology | Admitting: Urology

## 2014-12-27 DIAGNOSIS — N390 Urinary tract infection, site not specified: Secondary | ICD-10-CM | POA: Insufficient documentation

## 2014-12-27 DIAGNOSIS — R8281 Pyuria: Secondary | ICD-10-CM

## 2015-01-26 ENCOUNTER — Encounter (HOSPITAL_COMMUNITY): Payer: Self-pay | Admitting: Emergency Medicine

## 2015-01-26 ENCOUNTER — Emergency Department (HOSPITAL_COMMUNITY)
Admission: EM | Admit: 2015-01-26 | Discharge: 2015-01-26 | Disposition: A | Discharge status: Home / Self Care | Payer: Medicaid Other | Attending: Emergency Medicine | Admitting: Emergency Medicine

## 2015-01-26 ENCOUNTER — Emergency Department (HOSPITAL_COMMUNITY): Payer: Medicaid Other

## 2015-01-26 DIAGNOSIS — R109 Unspecified abdominal pain: Secondary | ICD-10-CM | POA: Diagnosis not present

## 2015-01-26 DIAGNOSIS — R011 Cardiac murmur, unspecified: Secondary | ICD-10-CM | POA: Diagnosis not present

## 2015-01-26 DIAGNOSIS — F329 Major depressive disorder, single episode, unspecified: Secondary | ICD-10-CM | POA: Diagnosis not present

## 2015-01-26 DIAGNOSIS — Z79899 Other long term (current) drug therapy: Secondary | ICD-10-CM | POA: Diagnosis not present

## 2015-01-26 DIAGNOSIS — Z8619 Personal history of other infectious and parasitic diseases: Secondary | ICD-10-CM | POA: Diagnosis not present

## 2015-01-26 DIAGNOSIS — F419 Anxiety disorder, unspecified: Secondary | ICD-10-CM | POA: Insufficient documentation

## 2015-01-26 DIAGNOSIS — Z7951 Long term (current) use of inhaled steroids: Secondary | ICD-10-CM | POA: Diagnosis not present

## 2015-01-26 DIAGNOSIS — Z793 Long term (current) use of hormonal contraceptives: Secondary | ICD-10-CM | POA: Insufficient documentation

## 2015-01-26 DIAGNOSIS — R1031 Right lower quadrant pain: Secondary | ICD-10-CM | POA: Diagnosis present

## 2015-01-26 DIAGNOSIS — Z791 Long term (current) use of non-steroidal anti-inflammatories (NSAID): Secondary | ICD-10-CM | POA: Insufficient documentation

## 2015-01-26 LAB — CBC WITH DIFFERENTIAL/PLATELET
BASOS ABS: 0 10*3/uL (ref 0.0–0.1)
Basophils Relative: 0 % (ref 0–1)
EOS ABS: 0.2 10*3/uL (ref 0.0–0.7)
EOS PCT: 2 % (ref 0–5)
HCT: 33.7 % — ABNORMAL LOW (ref 36.0–46.0)
Hemoglobin: 10.8 g/dL — ABNORMAL LOW (ref 12.0–15.0)
Lymphocytes Relative: 24 % (ref 12–46)
Lymphs Abs: 2.3 10*3/uL (ref 0.7–4.0)
MCH: 28.2 pg (ref 26.0–34.0)
MCHC: 32 g/dL (ref 30.0–36.0)
MCV: 88 fL (ref 78.0–100.0)
MONO ABS: 0.6 10*3/uL (ref 0.1–1.0)
MONOS PCT: 6 % (ref 3–12)
Neutro Abs: 6.9 10*3/uL (ref 1.7–7.7)
Neutrophils Relative %: 68 % (ref 43–77)
PLATELETS: 372 10*3/uL (ref 150–400)
RBC: 3.83 MIL/uL — ABNORMAL LOW (ref 3.87–5.11)
RDW: 13.1 % (ref 11.5–15.5)
WBC: 10 10*3/uL (ref 4.0–10.5)

## 2015-01-26 LAB — COMPREHENSIVE METABOLIC PANEL
ALT: 11 U/L — ABNORMAL LOW (ref 14–54)
ANION GAP: 6 (ref 5–15)
AST: 13 U/L — AB (ref 15–41)
Albumin: 3.6 g/dL (ref 3.5–5.0)
Alkaline Phosphatase: 66 U/L (ref 38–126)
BILIRUBIN TOTAL: 0.4 mg/dL (ref 0.3–1.2)
BUN: 7 mg/dL (ref 6–20)
CALCIUM: 8.5 mg/dL — AB (ref 8.9–10.3)
CHLORIDE: 104 mmol/L (ref 101–111)
CO2: 28 mmol/L (ref 22–32)
Creatinine, Ser: 0.6 mg/dL (ref 0.44–1.00)
GFR calc Af Amer: >60 mL/min (ref >60)
GFR calc non Af Amer: >60 mL/min (ref >60)
GLUCOSE: 99 mg/dL (ref 65–99)
POTASSIUM: 3.7 mmol/L (ref 3.5–5.1)
SODIUM: 138 mmol/L (ref 135–145)
Total Protein: 7.2 g/dL (ref 6.5–8.1)

## 2015-01-26 LAB — URINALYSIS, ROUTINE W REFLEX MICROSCOPIC
BILIRUBIN URINE: NEGATIVE
GLUCOSE, UA: NEGATIVE mg/dL
KETONES UR: NEGATIVE mg/dL
Leukocytes, UA: NEGATIVE
Nitrite: NEGATIVE
PH: 6.5 (ref 5.0–8.0)
Protein, ur: NEGATIVE mg/dL
Specific Gravity, Urine: <1.005 — ABNORMAL LOW (ref 1.005–1.030)
Urobilinogen, UA: 0.2 mg/dL (ref 0.0–1.0)

## 2015-01-26 LAB — URINE MICROSCOPIC-ADD ON

## 2015-01-26 LAB — PREGNANCY, URINE: PREG TEST UR: NEGATIVE

## 2015-01-26 MED ORDER — NAPROXEN 500 MG PO TABS: 500.0000 mg | ORAL_TABLET | Freq: Two times a day (BID) | ORAL | Status: DC

## 2015-01-26 NOTE — ED Provider Notes (Signed)
CSN: [id number]     Arrival date & time 01/26/15  1648 History   First MD Initiated Contact with Patient 01/26/15 1701     Chief Complaint  Patient presents with  . Abdominal Pain     (Consider location/radiation/quality/duration/timing/severity/associated sxs/prior Treatment) HPI..... Right lower quadrant pain, bloating since starting her menstrual cycle recently. Status post therapeutic abortion March 2015. She has had a C-section in the past. She is able to eat. No vaginal discharge. No fever, sweats, chills, dysuria. Pain is currently gone.  Past Medical History  Diagnosis Date  . Heart murmur   . Hx of trichomoniasis   . HPV (human papilloma virus) infection   . Anxiety   . Depression    Past Surgical History  Procedure Laterality Date  . Cesarean section    . Elective abortion     No family history on file. Social History  Substance Use Topics  . Smoking status: Never Smoker   . Smokeless tobacco: Never Used  . Alcohol Use: No   OB History    Gravida Para Term Preterm AB TAB SAB Ectopic Multiple Living   5 1  1 2  2   1      Review of Systems  All other systems reviewed and are negative.     Allergies  Compazine  Home Medications   Prior to Admission medications   Medication Sig Start Date End Date Taking? Authorizing Provider  benztropine (COGENTIN) 1 MG tablet Take 1 mg by mouth 2 (two) times daily.   Yes Historical Provider, MD  hydrOXYzine (ATARAX/VISTARIL) 50 MG tablet Take 50 mg by mouth 3 (three) times daily as needed.   Yes Historical Provider, MD  QUEtiapine (SEROQUEL) 200 MG tablet Take 200 mg by mouth at bedtime.   Yes Historical Provider, MD  sertraline (ZOLOFT) 100 MG tablet Take 100 mg by mouth daily.   Yes Historical Provider, MD  desogestrel-ethinyl estradiol (APRI,EMOQUETTE,SOLIA) 0.15-30 MG-MCG tablet Take 1 tablet by mouth daily. 10/17/14   Florian Buff, MD  fluticasone (FLONASE) 50 MCG/ACT nasal spray Place 2 sprays into both nostrils  daily. Patient not taking: Reported on 07/23/2014 03/08/14   Merryl Hacker, MD  LORazepam (ATIVAN) 1 MG tablet Take 1 tablet (1 mg total) by mouth at bedtime. Patient not taking: Reported on 10/17/2014 07/23/14   [name], PA-C  naproxen (NAPROSYN) 500 MG tablet Take 1 tablet (500 mg total) by mouth 2 (two) times daily. 01/26/15   Nat Christen, MD  nitrofurantoin, macrocrystal-monohydrate, (MACROBID) 100 MG capsule Take 1 capsule (100 mg total) by mouth 2 (two) times daily. X 7 days 10/24/14   Shanon Rosser, MD  piroxicam (FELDENE) 20 MG capsule Take 1 capsule (20 mg total) by mouth daily. 10/17/14   Florian Buff, MD  sodium chloride (OCEAN) 0.65 % SOLN nasal spray Place 1 spray into both nostrils as needed for congestion. Patient not taking: Reported on 07/23/2014 03/08/14   Merryl Hacker, MD   BP 120/69 mmHg  Pulse 88  Temp(Src) 98.7 F (37.1 C) (Oral)  Resp 17  Ht 5\' 6"  (1.676 m)  Wt 192 lb (87.091 kg)  BMI 31.00 kg/m2  SpO2 99%  LMP 01/23/2015 Physical Exam  Constitutional: She is oriented to person, place, and time. She appears well-developed and well-nourished.  HENT:  Head: Normocephalic and atraumatic.  Eyes: Conjunctivae and EOM are normal. Pupils are equal, round, and reactive to light.  Neck: Normal range of motion. Neck supple.  Cardiovascular: Normal rate  and regular rhythm.   Pulmonary/Chest: Effort normal and breath sounds normal.  Abdominal: Soft. Bowel sounds are normal.  abd nontender  Musculoskeletal: Normal range of motion.  Neurological: She is alert and oriented to person, place, and time.  Skin: Skin is warm and dry.  Psychiatric: She has a normal mood and affect. Her behavior is normal.  Nursing note and vitals reviewed.   ED Course  Procedures (including critical care time) Labs Review Labs Reviewed  URINALYSIS, ROUTINE W REFLEX MICROSCOPIC (NOT AT [facility]) - Abnormal; Notable for the following:    Specific Gravity, Urine <1.005 (*)    Hgb urine  dipstick LARGE (*)    All other components within normal limits  CBC WITH DIFFERENTIAL/PLATELET - Abnormal; Notable for the following:    RBC 3.83 (*)    Hemoglobin 10.8 (*)    HCT 33.7 (*)    All other components within normal limits  COMPREHENSIVE METABOLIC PANEL - Abnormal; Notable for the following:    Calcium 8.5 (*)    AST 13 (*)    ALT 11 (*)    All other components within normal limits  URINE MICROSCOPIC-ADD ON - Abnormal; Notable for the following:    Squamous Epithelial / LPF FEW (*)    Bacteria, UA FEW (*)    All other components within normal limits  PREGNANCY, URINE    Imaging Review Dg Abd Acute W/chest  01/26/2015   CLINICAL DATA:  Right lower abdominal pain and swelling.  Nausea.  EXAM: DG ABDOMEN ACUTE W/ 1V CHEST  COMPARISON:  06/29/2010  FINDINGS: Normal heart size and pulmonary vascularity. No focal airspace disease or consolidation in the lungs. No blunting of costophrenic angles. No pneumothorax. Mediastinal contours appear intact.  Scattered gas and stool in the colon. No small or large bowel distention. No free intra-abdominal air. No abnormal air-fluid levels. No radiopaque stones. Visualized bones appear intact.  IMPRESSION: Negative abdominal radiographs.  No acute cardiopulmonary disease.   Electronically Signed   By: Lucienne Capers M.D.   On: 01/26/2015 19:04   I have personally reviewed and evaluated these images and lab results as part of my medical decision-making.   EKG Interpretation None      MDM   Final diagnoses:  Abdominal pain, unspecified abdominal location    No acute abdomen. White count normal. Urinalysis shows no evidence of infection. Acute abdominal series negative for obstruction.  Discharge medications Naprosyn 500 mg    Nat Christen, MD 01/26/15 2231

## 2015-01-26 NOTE — ED Notes (Signed)
Patient c/o right lower abd pain, nausea, and bloating. Per patient has had right pelvic/lower abd pain every time she starts mensuration since having an abortion last year. Denies any fevers or urinary symptoms.

## 2015-01-26 NOTE — ED Notes (Signed)
Patient  Wanting to know how much longer she will be here and if she is able to eat.

## 2015-01-26 NOTE — ED Notes (Signed)
Urine sample obtained.

## 2015-01-26 NOTE — Discharge Instructions (Signed)
Prescription for pain medication. Follow-up. Doctor.

## 2015-01-26 NOTE — ED Notes (Signed)
Ambulatory to restroom without difficutly.

## 2015-01-26 NOTE — ED Notes (Signed)
MD at bedside. 

## 2015-02-18 ENCOUNTER — Ambulatory Visit: Payer: Medicaid Other | Admitting: Obstetrics & Gynecology

## 2015-05-14 ENCOUNTER — Telehealth: Payer: Self-pay | Admitting: *Deleted

## 2015-05-14 NOTE — Telephone Encounter (Signed)
Pt states when should she start her Apri BCP, started period yesterday. Per Derrek Monaco, NP, pt can start her BCP as package instruct on Sunday, take same time of day, and use condoms or back up for 2-3 weeks. Pt verbalized understanding.

## 2015-06-04 ENCOUNTER — Other Ambulatory Visit: Payer: Medicaid Other | Admitting: Obstetrics & Gynecology

## 2015-06-10 ENCOUNTER — Other Ambulatory Visit: Payer: Medicaid Other | Admitting: Obstetrics & Gynecology

## 2015-06-17 ENCOUNTER — Other Ambulatory Visit: Payer: Medicaid Other | Admitting: Obstetrics & Gynecology

## 2015-06-27 ENCOUNTER — Other Ambulatory Visit: Payer: Medicaid Other | Admitting: Obstetrics & Gynecology

## 2015-07-08 ENCOUNTER — Other Ambulatory Visit: Payer: Medicaid Other | Admitting: Obstetrics & Gynecology

## 2015-07-23 ENCOUNTER — Other Ambulatory Visit (HOSPITAL_COMMUNITY)
Admission: RE | Admit: 2015-07-23 | Discharge: 2015-07-23 | Disposition: A | Discharge status: Home / Self Care | Payer: Medicaid Other | Source: Ambulatory Visit | Attending: Obstetrics & Gynecology | Admitting: Obstetrics & Gynecology

## 2015-07-23 ENCOUNTER — Encounter: Payer: Self-pay | Admitting: Obstetrics & Gynecology

## 2015-07-23 ENCOUNTER — Ambulatory Visit (INDEPENDENT_AMBULATORY_CARE_PROVIDER_SITE_OTHER): Payer: Medicaid Other | Admitting: Obstetrics & Gynecology

## 2015-07-23 VITALS — BP 110/60 | HR 80 | Ht 64.4 in | Wt 190.0 lb

## 2015-07-23 DIAGNOSIS — Z01419 Encounter for gynecological examination (general) (routine) without abnormal findings: Secondary | ICD-10-CM | POA: Insufficient documentation

## 2015-07-23 DIAGNOSIS — Z0000 Encounter for general adult medical examination without abnormal findings: Secondary | ICD-10-CM

## 2015-07-23 MED ORDER — DESOGESTREL-ETHINYL ESTRADIOL 0.15-30 MG-MCG PO TABS: 1.0000 | ORAL_TABLET | Freq: Every day | ORAL | Status: DC

## 2015-07-23 NOTE — Progress Notes (Signed)
Patient ID: Cynthia Molina, female   DOB: January 10, 1981, 35 y.o.   MRN: [id number] Subjective:     Cynthia Molina is a 35 y.o. female here for a routine exam.  No LMP recorded. [id number] Birth Control Method:  OCP Menstrual Calendar(currently): regular  Current complaints: none.   Current acute medical issues:  none   Recent Gynecologic History No LMP recorded. Last Pap: 2015,  normal Last mammogram: ,    Past Medical History  Diagnosis Date  . Heart murmur   . Hx of trichomoniasis   . HPV (human papilloma virus) infection   . Anxiety   . Depression     Past Surgical History  Procedure Laterality Date  . Cesarean section    . Elective abortion      OB History    Gravida Para Term Preterm AB TAB SAB Ectopic Multiple Living   5 1  1 2  2   1       Social History   Social History  . Marital Status: Married    Spouse Name: N/A  . Number of Children: N/A  . Years of Education: N/A   Social History Main Topics  . Smoking status: Never Smoker   . Smokeless tobacco: Never Used  . Alcohol Use: No  . Drug Use: No  . Sexual Activity: Yes    Birth Control/ Protection: None   Other Topics Concern  . None   Social History Narrative    History reviewed. No pertinent family history.   Current outpatient prescriptions:  .  benztropine (COGENTIN) 1 MG tablet, Take 1 mg by mouth 2 (two) times daily., Disp: , Rfl:  .  desogestrel-ethinyl estradiol (APRI,EMOQUETTE,SOLIA) 0.15-30 MG-MCG tablet, Take 1 tablet by mouth daily., Disp: 1 Package, Rfl: 11 .  hydrOXYzine (ATARAX/VISTARIL) 50 MG tablet, Take 50 mg by mouth 3 (three) times daily as needed., Disp: , Rfl:  .  QUEtiapine (SEROQUEL) 200 MG tablet, Take 200 mg by mouth at bedtime., Disp: , Rfl:  .  sertraline (ZOLOFT) 100 MG tablet, Take 100 mg by mouth daily., Disp: , Rfl:  .  [DISCONTINUED] OXcarbazepine (TRILEPTAL) 150 MG tablet, Take 150 mg by mouth 2 (two) times daily., Disp: , Rfl:   Review of Systems  Review of  Systems  Constitutional: Negative for fever, chills, weight loss, malaise/fatigue and diaphoresis.  HENT: Negative for hearing loss, ear pain, nosebleeds, congestion, sore throat, neck pain, tinnitus and ear discharge.   Eyes: Negative for blurred vision, double vision, photophobia, pain, discharge and redness.  Respiratory: Negative for cough, hemoptysis, sputum production, shortness of breath, wheezing and stridor.   Cardiovascular: Negative for chest pain, palpitations, orthopnea, claudication, leg swelling and PND.  Gastrointestinal: negative for abdominal pain. Negative for heartburn, nausea, vomiting, diarrhea, constipation, blood in stool and melena.  Genitourinary: Negative for dysuria, urgency, frequency, hematuria and flank pain.  Musculoskeletal: Negative for myalgias, back pain, joint pain and falls.  Skin: Negative for itching and rash.  Neurological: Negative for dizziness, tingling, tremors, sensory change, speech change, focal weakness, seizures, loss of consciousness, weakness and headaches.  Endo/Heme/Allergies: Negative for environmental allergies and polydipsia. Does not bruise/bleed easily.  Psychiatric/Behavioral: Negative for depression, suicidal ideas, hallucinations, memory loss and substance abuse. The patient is not nervous/anxious and does not have insomnia.        Objective:  Blood pressure 110/60, pulse 80, height 5' 4.4" (1.636 m), weight 190 lb (86.183 kg).   Physical Exam  Vitals reviewed. Constitutional: She is oriented  to person, place, and time. She appears well-developed and well-nourished.  HENT:  Head: Normocephalic and atraumatic.        Right Ear: External ear normal.  Left Ear: External ear normal.  Nose: Nose normal.  Mouth/Throat: Oropharynx is clear and moist.  Eyes: Conjunctivae and EOM are normal. Pupils are equal, round, and reactive to light. Right eye exhibits no discharge. Left eye exhibits no discharge. No scleral icterus.  Neck: Normal  range of motion. Neck supple. No tracheal deviation present. No thyromegaly present.  Cardiovascular: Normal rate, regular rhythm, normal heart sounds and intact distal pulses.  Exam reveals no gallop and no friction rub.   No murmur heard. Respiratory: Effort normal and breath sounds normal. No respiratory distress. She has no wheezes. She has no rales. She exhibits no tenderness.  GI: Soft. Bowel sounds are normal. She exhibits no distension and no mass. There is no tenderness. There is no rebound and no guarding.  Genitourinary:  Breasts no masses skin changes or nipple changes bilaterally      Vulva is normal without lesions Vagina is pink moist without discharge Cervix normal in appearance and pap is done Uterus is normal size shape and contour Adnexa is negative with normal sized ovaries   Musculoskeletal: Normal range of motion. She exhibits no edema and no tenderness.  Neurological: She is alert and oriented to person, place, and time. She has normal reflexes. She displays normal reflexes. No cranial nerve deficit. She exhibits normal muscle tone. Coordination normal.  Skin: Skin is warm and dry. No rash noted. No erythema. No pallor.  Psychiatric: She has a normal mood and affect. Her behavior is normal. Judgment and thought content normal.       Assessment:    Healthy female exam.    Plan:    Follow up in: 1 year.    Meds ordered this encounter  Medications  . desogestrel-ethinyl estradiol (APRI,EMOQUETTE,SOLIA) 0.15-30 MG-MCG tablet    Sig: Take 1 tablet by mouth daily.    Dispense:  1 Package    Refill:  11    No orders of the defined types were placed in this encounter.

## 2015-07-25 ENCOUNTER — Telehealth: Payer: Self-pay | Admitting: Obstetrics & Gynecology

## 2015-07-25 ENCOUNTER — Telehealth: Payer: Self-pay | Admitting: *Deleted

## 2015-07-25 LAB — CYTOLOGY - PAP

## 2015-07-25 MED ORDER — METRONIDAZOLE 500 MG PO TABS: 500.0000 mg | ORAL_TABLET | Freq: Two times a day (BID) | ORAL | Status: DC

## 2015-07-25 NOTE — Telephone Encounter (Signed)
+  trichomonas on pap E prescribed metronidazole 500 BID x 7 days, refill for partner  Advise no sex for 3 weeks and come back to office for [facility] evaluation

## 2015-07-25 NOTE — Telephone Encounter (Signed)
Pt informed of Trich on Pap from 07/23/2015, RX sent for pt and partner to [place], no sex for 3 weeks, appt scheduled for 08/16/2015 for TOC. Pt verbalized understanding.

## 2015-07-26 NOTE — Telephone Encounter (Signed)
PT called

## 2015-08-04 IMAGING — US US RENAL
1 series · 14 of 25 positions shown · non-contrast
Comparison: 12/11/13

CLINICAL DATA: Pyuria

EXAM:
RENAL / URINARY TRACT ULTRASOUND COMPLETE

[Series 1: us renal · 0.21mm/px · 14 of 26 slices shown]
[im 1/26]
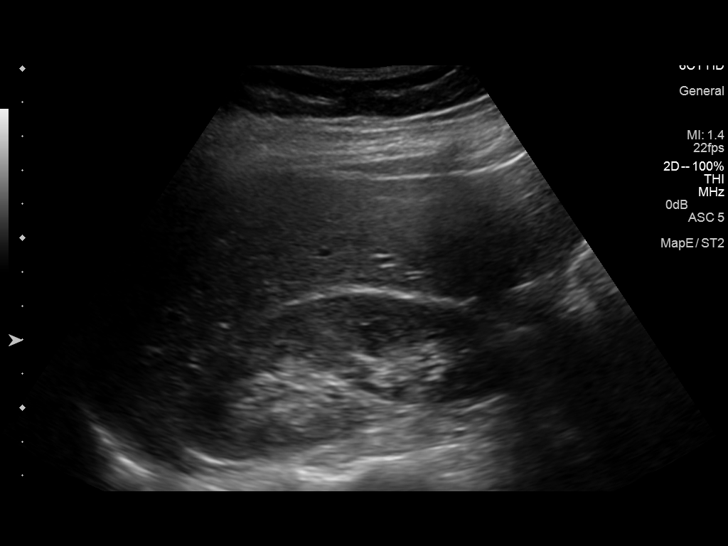
[im 3/26]
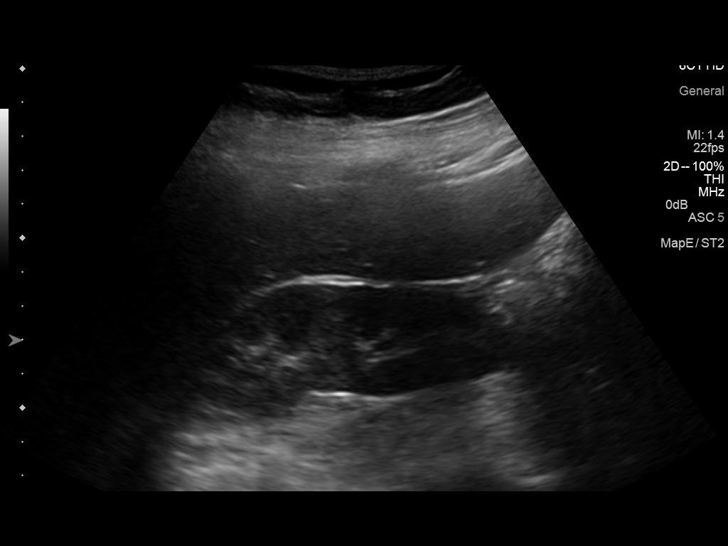
[im 5/26]
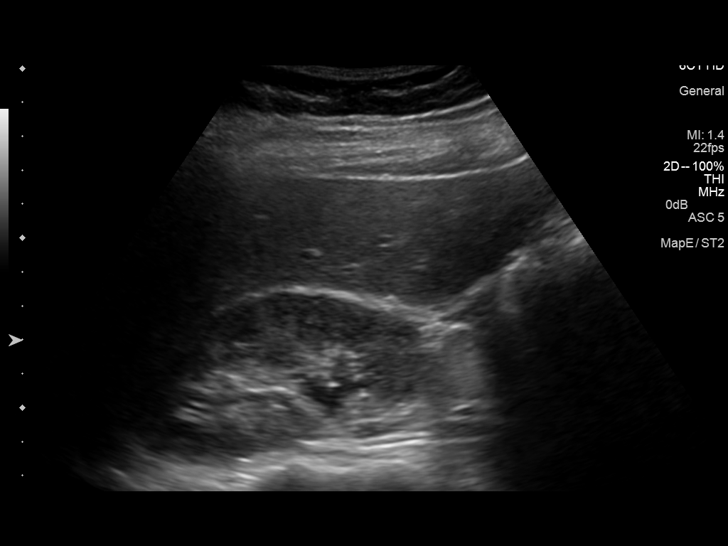
[im 7/26]
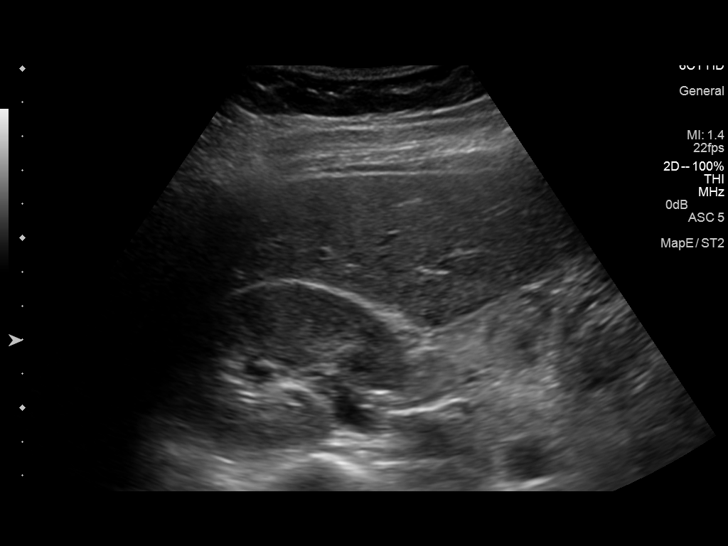
[im 9/26]
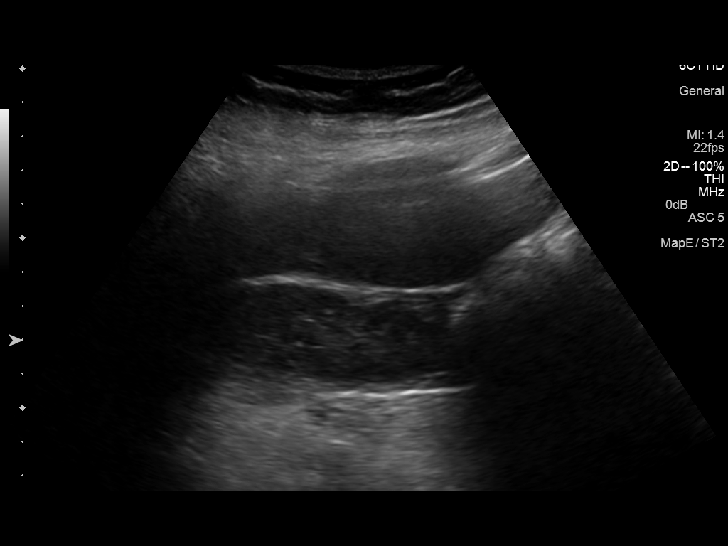
[im 10/26]
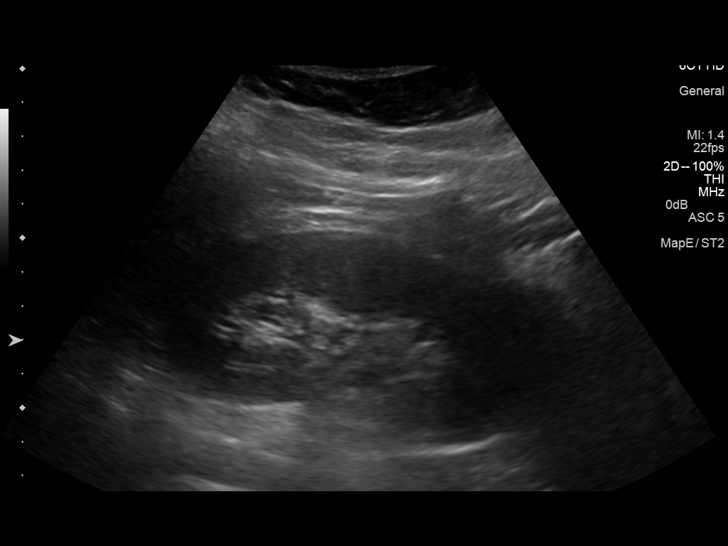
[im 12/26]
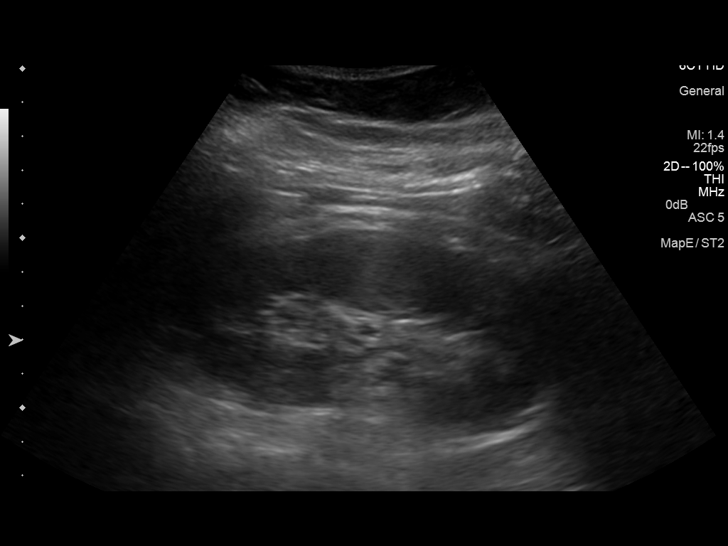
[im 14/26]
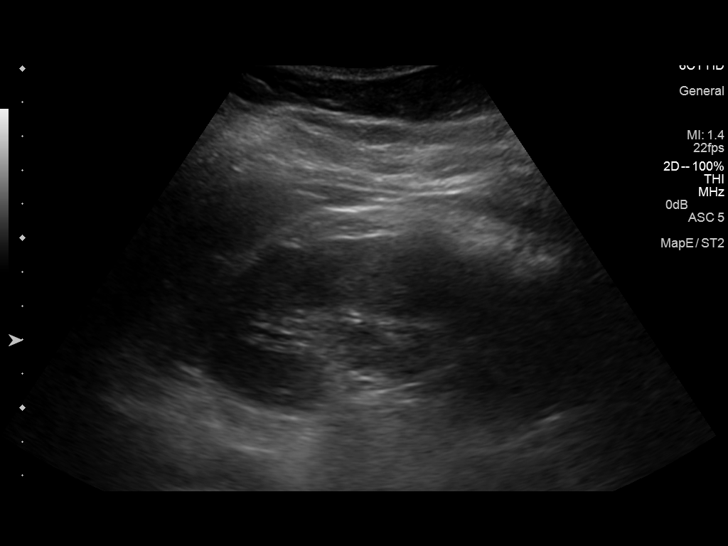
[im 16/26]
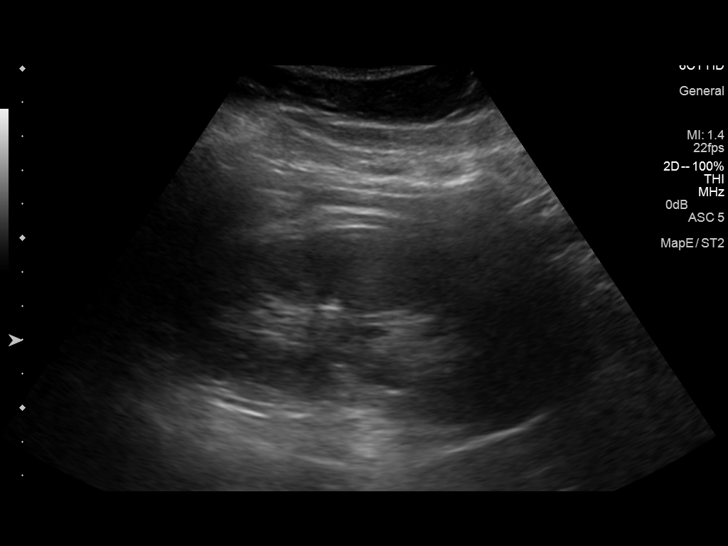
[im 17/26]
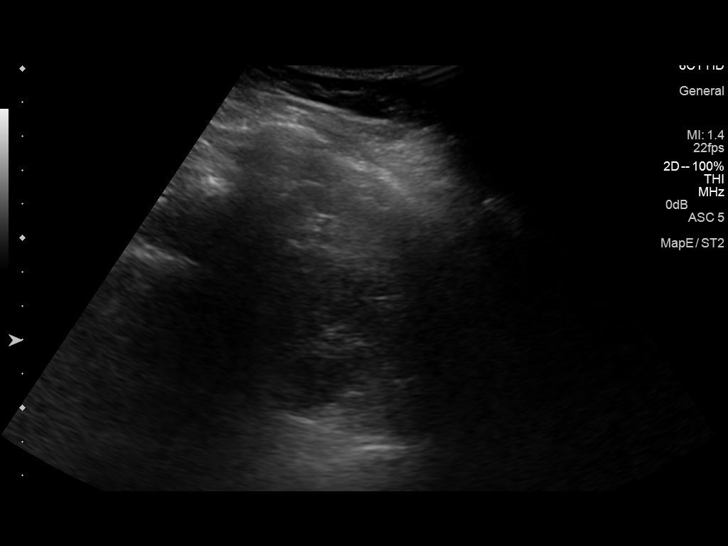
[im 19/26]
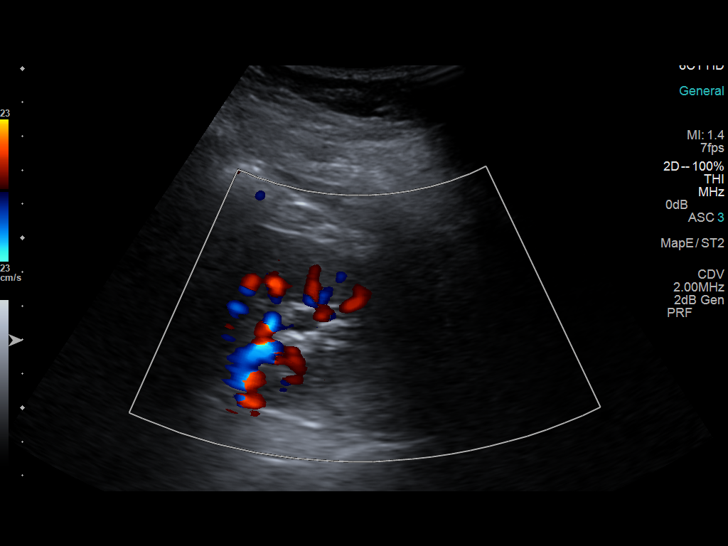
[im 21/26]
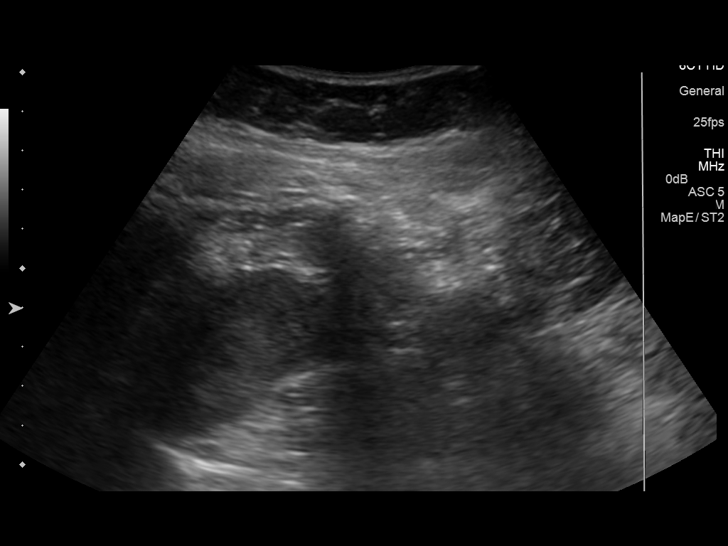
[im 23/26]
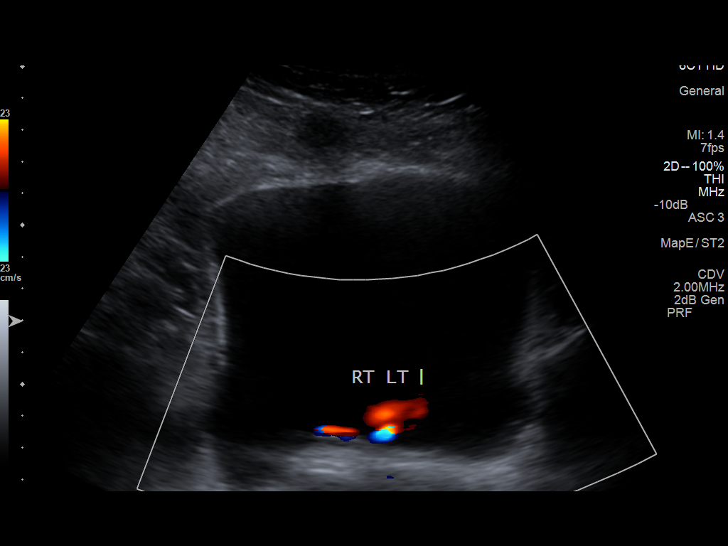
[im 26/26]
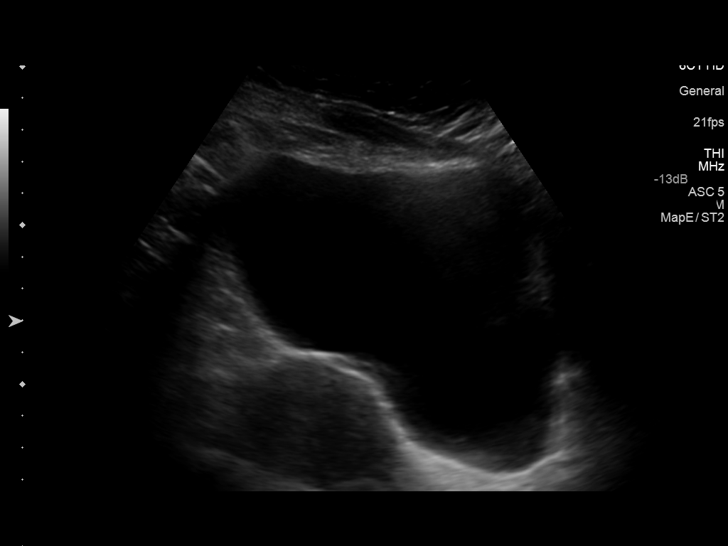

[14 of 25 positions shown; findings below may reference images not displayed]

FINDINGS: Right Kidney:

Length: 10.1 cm. Echogenicity within normal limits. No mass or
hydronephrosis visualized.

Left Kidney:

Length: 11.4 cm. Echogenicity within normal limits. No mass or
hydronephrosis visualized.

Bladder:

Appears normal for degree of bladder distention.
IMPRESSION: 1. Normal renal sonogram.

## 2015-08-16 ENCOUNTER — Ambulatory Visit (INDEPENDENT_AMBULATORY_CARE_PROVIDER_SITE_OTHER): Payer: Medicaid Other | Admitting: Obstetrics & Gynecology

## 2015-08-16 ENCOUNTER — Encounter: Payer: Self-pay | Admitting: Obstetrics & Gynecology

## 2015-08-16 VITALS — BP 98/60 | HR 72 | Wt 196.0 lb

## 2015-08-16 DIAGNOSIS — A5901 Trichomonal vulvovaginitis: Secondary | ICD-10-CM

## 2015-08-16 NOTE — Progress Notes (Signed)
Patient ID: Cynthia Molina, female   DOB: 10-Aug-1980, 35 y.o.   MRN: [id number]       Chief Complaint  Patient presents with  . gyn visit    proof of cure    Blood pressure 98/60, pulse 72, weight 196 lb (88.905 kg).  35 y.o. [id number] No LMP recorded. The current method of family planning is .  Subjective TOC for trichomonas Previous treatment flagyl x 7 days  Objective Vulva:  normal appearing vulva with no masses, tenderness or lesions Vagina:  normal mucosa, no discharge Cervix:   Uterus:   Adnexa: ovaries:,       Pertinent ROS   Labs or studies Wet Prep:   A sample of vaginal discharge was obtained from the posterior fornix using a cotton swab. 2 drops of saline were placed on a slide and the cotton swab was immersed in the saline. Microscopic evaluation was performed and results were as follows:  Negative  for yeast  Negative for clue cells , consistent with Bacterial vaginosis Negative for trichomonas  Normal WBC population   Whiff test: Negative     Impression Diagnoses this Encounter::   ICD-9-CM ICD-10-CM   1. Trichomonas vaginitis 131.01 A59.01     Established relevant diagnosis(es):   Plan/Recommendations: No orders of the defined types were placed in this encounter.    Labs or Scans Ordered: No orders of the defined types were placed in this encounter.    Management::   Follow up Return if symptoms worsen or fail to improve.         All questions were answered.

## 2015-09-24 ENCOUNTER — Ambulatory Visit: Payer: Medicaid Other | Admitting: Cardiovascular Disease

## 2015-09-24 ENCOUNTER — Encounter: Payer: Self-pay | Admitting: Cardiovascular Disease

## 2015-10-03 ENCOUNTER — Ambulatory Visit (INDEPENDENT_AMBULATORY_CARE_PROVIDER_SITE_OTHER): Payer: Medicaid Other | Admitting: Cardiovascular Disease

## 2015-10-03 ENCOUNTER — Encounter: Payer: Self-pay | Admitting: Cardiovascular Disease

## 2015-10-03 VITALS — BP 102/60 | HR 95 | Ht 65.0 in | Wt 193.0 lb

## 2015-10-03 DIAGNOSIS — R0602 Shortness of breath: Secondary | ICD-10-CM | POA: Diagnosis not present

## 2015-10-03 NOTE — Patient Instructions (Signed)
Your physician recommends that you schedule a follow-up appointment in: as needed   Thank you for choosing [facility] !         

## 2015-10-03 NOTE — Progress Notes (Signed)
Patient ID: Cynthia Molina, female   DOB: December 16, 1980, 35 y.o.   MRN: [id number]       CARDIOLOGY CONSULT NOTE  Patient ID: Cynthia Molina MRN: [id number] DOB/AGE: 11-22-1980 35 y.o.  Admit date: (Not on file) Primary Physician: Maggie Font, MD Referring Physician: Berdine Addison MD  Reason for Consultation: SOB  HPI: The patient is a 35 yr old woman with a past medical history significant for bipolar disorder, anxiety disorder with panic attacks, and PTSD. She complains of difficulty swallowing saliva at times. She denies dysphagia for solids or liquids. She is sometimes awoken at night with shortness of breath. There is no orthopnea, palpitations, chest pain, or leg swelling. She occasionally has some mild shortness of breath with exertion when climbing up stairs or vigorously exerting herself. She denies syncope. She reportedly has a history of a heart murmur since birth.  ECG performed in the office today which I personally reviewed demonstrates normal sinus rhythm with no ischemic ST segment or T-wave abnormalities, nor any arrhythmias.    Allergies  Allergen Reactions  . Compazine [Prochlorperazine Edisylate] Other (See Comments)    "jittery"    Current Outpatient Prescriptions  Medication Sig Dispense Refill  . benztropine (COGENTIN) 1 MG tablet Take 1 mg by mouth 2 (two) times daily.    . hydrOXYzine (ATARAX/VISTARIL) 50 MG tablet Take 50 mg by mouth 3 (three) times daily as needed.    . sertraline (ZOLOFT) 100 MG tablet Take 100 mg by mouth daily.    . QUEtiapine (SEROQUEL) 200 MG tablet Take 200 mg by mouth at bedtime. Reported on 10/03/2015    . [DISCONTINUED] OXcarbazepine (TRILEPTAL) 150 MG tablet Take 150 mg by mouth 2 (two) times daily.     No current facility-administered medications for this visit.    Past Medical History  Diagnosis Date  . Heart murmur   . Hx of trichomoniasis   . HPV (human papilloma virus) infection   . Anxiety   . Depression     Past  Surgical History  Procedure Laterality Date  . Cesarean section    . Elective abortion      Social History   Social History  . Marital Status: Married    Spouse Name: N/A  . Number of Children: N/A  . Years of Education: N/A   Occupational History  . Not on file.   Social History Main Topics  . Smoking status: Never Smoker   . Smokeless tobacco: Never Used  . Alcohol Use: No  . Drug Use: No  . Sexual Activity: Yes    Birth Control/ Protection: None   Other Topics Concern  . Not on file   Social History Narrative     No family history of premature CAD in 1st degree relatives.  Prior to Admission medications   Medication Sig Start Date End Date Taking? Authorizing Provider  benztropine (COGENTIN) 1 MG tablet Take 1 mg by mouth 2 (two) times daily.    Historical Provider, MD  desogestrel-ethinyl estradiol (APRI,EMOQUETTE,SOLIA) 0.15-30 MG-MCG tablet Take 1 tablet by mouth daily. 07/23/15   Florian Buff, MD  hydrOXYzine (ATARAX/VISTARIL) 50 MG tablet Take 50 mg by mouth 3 (three) times daily as needed.    Historical Provider, MD  QUEtiapine (SEROQUEL) 200 MG tablet Take 200 mg by mouth at bedtime.    Historical Provider, MD  sertraline (ZOLOFT) 100 MG tablet Take 100 mg by mouth daily.    Historical Provider, MD     Review of  systems complete and found to be negative unless listed above in HPI     Physical exam Blood pressure 102/60, pulse 95, height 5\' 5"  (1.651 m), weight 193 lb (87.544 kg), last menstrual period 09/27/2015, SpO2 97 %. General: NAD Neck: No JVD, no thyromegaly or thyroid nodule.  Lungs: Clear to auscultation bilaterally with normal respiratory effort. CV: Nondisplaced PMI. Regular rate and rhythm, normal S1/S2, no S3/S4, no murmur.  No peripheral edema.  No carotid bruit.  Normal pedal pulses.  Abdomen: Soft, nontender, obese, no distention.  Skin: Intact without lesions or rashes.  Neurologic: Alert and oriented x 3.  Psych: Normal  affect. Extremities: No clubbing or cyanosis.  HEENT: Normal.   ECG: Most recent ECG reviewed.  Labs:   Lab Results  Component Value Date   WBC 10.0 01/26/2015   HGB 10.8* 01/26/2015   HCT 33.7* 01/26/2015   MCV 88.0 01/26/2015   PLT 372 01/26/2015   No results for input(s): NA, K, CL, CO2, BUN, CREATININE, CALCIUM, PROT, BILITOT, ALKPHOS, ALT, AST, GLUCOSE in the last 168 hours.  Invalid input(s): LABALBU No results found for: CKTOTAL, CKMB, CKMBINDEX, TROPONINI No results found for: CHOL No results found for: HDL No results found for: LDLCALC No results found for: TRIG No results found for: CHOLHDL No results found for: LDLDIRECT       Studies: No results found.  ASSESSMENT AND PLAN:  1. Shortness of breath: Given her normal physical exam and normal ECG, I do not feel this is a cardiac issue and no further cardiac testing is warranted at this time. She may be experiencing panic attacks. I will defer to her PCP for further management. She has no dysphagia for solids or liquids to suggest acid reflux disease.  Dispo: fu prn.   Signed: Kate Sable, M.D., F.A.C.C.  10/03/2015, 1:39 PM

## 2015-11-28 ENCOUNTER — Ambulatory Visit (INDEPENDENT_AMBULATORY_CARE_PROVIDER_SITE_OTHER): Payer: Medicaid Other | Admitting: Otolaryngology

## 2016-07-07 ENCOUNTER — Encounter (INDEPENDENT_AMBULATORY_CARE_PROVIDER_SITE_OTHER): Payer: Self-pay

## 2016-07-07 ENCOUNTER — Ambulatory Visit (INDEPENDENT_AMBULATORY_CARE_PROVIDER_SITE_OTHER): Payer: Medicaid Other | Admitting: Women's Health

## 2016-07-07 ENCOUNTER — Encounter: Payer: Self-pay | Admitting: Women's Health

## 2016-07-07 VITALS — BP 110/58 | HR 76 | Wt 187.0 lb

## 2016-07-07 DIAGNOSIS — N898 Other specified noninflammatory disorders of vagina: Secondary | ICD-10-CM | POA: Diagnosis not present

## 2016-07-07 DIAGNOSIS — B9689 Other specified bacterial agents as the cause of diseases classified elsewhere: Secondary | ICD-10-CM

## 2016-07-07 DIAGNOSIS — N760 Acute vaginitis: Secondary | ICD-10-CM

## 2016-07-07 LAB — POCT WET PREP (WET MOUNT)
Clue Cells Wet Prep Whiff POC: POSITIVE
Trichomonas Wet Prep HPF POC: ABSENT

## 2016-07-07 MED ORDER — METRONIDAZOLE 0.75 % VA GEL: 1.0000 | Freq: Every day | VAGINAL | 0 refills | Status: DC

## 2016-07-07 NOTE — Progress Notes (Signed)
   [facility] Visit  Patient name: Cynthia Molina MRN [id number]  Date of birth: 1980-10-08  CC & HPI:  Cynthia Molina is a 36 y.o. [id number] African American female presenting today for report of maldorous d/c x few days. No itching/irritation. Had trichomonas Feb 2017, no poc.  Patient's last menstrual period was 06/21/2016 (exact date). The current method of family planning is none and is ok if she gets pregnant Last pap 07/23/15, neg  Pertinent History Reviewed:  Medical & Surgical Hx:   Past medical, surgical, family, and social history reviewed in electronic medical record Medications: Reviewed & Updated - see associated section Allergies: Reviewed in electronic medical record  Objective Findings:  Vitals: BP (!) 110/58 (BP Location: Right Arm, Patient Position: Sitting, Cuff Size: Normal)   Pulse 76   Wt 187 lb (84.8 kg)   LMP 06/21/2016 (Exact Date)   BMI 31.12 kg/m  Body mass index is 31.12 kg/m.  Physical Examination: General appearance - alert, well appearing, and in no distress Pelvic - cx clear, small amt thin white malodorous d/c  Results for orders placed or performed in visit on 07/07/16 (from the past 24 hour(s))  POCT Wet Prep [facility] [place])   Collection Time: 07/07/16  2:19 PM  Result Value Ref Range   Source Wet Prep POC vaginal    WBC, Wet Prep HPF POC none    Bacteria Wet Prep HPF POC None (A) Few   BACTERIA WET PREP MORPHOLOGY POC     Clue Cells Wet Prep HPF POC Many (A) None   Clue Cells Wet Prep Whiff POC Positive Whiff    Yeast Wet Prep HPF POC None    KOH Wet Prep POC     Trichomonas Wet Prep HPF POC Absent Absent     Assessment & Plan:  A:   BV  P:  Rx metrogel q hs x 5nights per pt preference  F/U as scheduled for pap & physical in Feb  Start pnv daily in case gets pregnant  Tawnya Crook CNM, [facility] 07/07/2016 2:20 PM

## 2016-07-10 LAB — GC/CHLAMYDIA PROBE AMP
Chlamydia trachomatis, NAA: NEGATIVE
NEISSERIA GONORRHOEAE BY PCR: NEGATIVE

## 2016-07-14 ENCOUNTER — Telehealth: Payer: Self-pay | Admitting: Women's Health

## 2016-07-14 ENCOUNTER — Other Ambulatory Visit: Payer: Self-pay | Admitting: Advanced Practice Midwife

## 2016-07-14 MED ORDER — FLUCONAZOLE 150 MG PO TABS: ORAL_TABLET | ORAL | 2 refills | Status: DC

## 2016-07-14 NOTE — Telephone Encounter (Signed)
Patient called stating she finished her Metrogel on Saturday but has noticed "the bacteria", "white chunks" coming out. She also is red, irritated and having slight itching. Please advise.

## 2016-07-24 ENCOUNTER — Other Ambulatory Visit: Payer: Medicaid Other | Admitting: Obstetrics & Gynecology

## 2016-08-03 ENCOUNTER — Encounter: Payer: Self-pay | Admitting: Obstetrics & Gynecology

## 2016-08-03 ENCOUNTER — Other Ambulatory Visit (HOSPITAL_COMMUNITY)
Admission: RE | Admit: 2016-08-03 | Discharge: 2016-08-03 | Disposition: A | Discharge status: Home / Self Care | Payer: Medicaid Other | Source: Ambulatory Visit | Attending: Obstetrics & Gynecology | Admitting: Obstetrics & Gynecology

## 2016-08-03 ENCOUNTER — Ambulatory Visit (INDEPENDENT_AMBULATORY_CARE_PROVIDER_SITE_OTHER): Payer: Medicaid Other | Admitting: Obstetrics & Gynecology

## 2016-08-03 VITALS — BP 124/60 | HR 70 | Ht 64.5 in | Wt 186.5 lb

## 2016-08-03 DIAGNOSIS — Z113 Encounter for screening for infections with a predominantly sexual mode of transmission: Secondary | ICD-10-CM | POA: Insufficient documentation

## 2016-08-03 DIAGNOSIS — Z01419 Encounter for gynecological examination (general) (routine) without abnormal findings: Secondary | ICD-10-CM | POA: Insufficient documentation

## 2016-08-03 DIAGNOSIS — Z1151 Encounter for screening for human papillomavirus (HPV): Secondary | ICD-10-CM | POA: Diagnosis present

## 2016-08-03 DIAGNOSIS — R350 Frequency of micturition: Secondary | ICD-10-CM

## 2016-08-03 DIAGNOSIS — Z124 Encounter for screening for malignant neoplasm of cervix: Secondary | ICD-10-CM

## 2016-08-03 DIAGNOSIS — Z0000 Encounter for general adult medical examination without abnormal findings: Secondary | ICD-10-CM

## 2016-08-03 DIAGNOSIS — R8290 Unspecified abnormal findings in urine: Secondary | ICD-10-CM

## 2016-08-03 LAB — POCT URINALYSIS DIPSTICK
Glucose, UA: NEGATIVE
KETONES UA: NEGATIVE
LEUKOCYTES UA: NEGATIVE
NITRITE UA: NEGATIVE
PROTEIN UA: NEGATIVE
RBC UA: NEGATIVE

## 2016-08-03 NOTE — Progress Notes (Signed)
Subjective:     Cynthia Molina is a 36 y.o. female here for a routine exam.  Patient's last menstrual period was 07/18/2016 (exact date). [id number] Birth Control Method:  none Menstrual Calendar(currently): regular  Current complaints: none.   Current acute medical issues:  none   Recent Gynecologic History Patient's last menstrual period was 07/18/2016 (exact date). Last Pap: 2017,  normal Last mammogram: ,    Past Medical History:  Diagnosis Date  . Anxiety   . Depression   . Heart murmur   . HPV (human papilloma virus) infection   . Hx of trichomoniasis     Past Surgical History:  Procedure Laterality Date  . CESAREAN SECTION    . elective abortion      OB History    Gravida Para Term Preterm AB Living   5 1   1 4 1    SAB TAB Ectopic Multiple Live Births   2       1      Social History   Social History  . Marital status: Married    Spouse name: N/A  . Number of children: N/A  . Years of education: N/A   Social History Main Topics  . Smoking status: Never Smoker  . Smokeless tobacco: Never Used  . Alcohol use No  . Drug use: No  . Sexual activity: Yes    Birth control/ protection: None   Other Topics Concern  . None   Social History Narrative  . None    Family History  Problem Relation Age of Onset  . Other Father     collapsed lung; breathing issues  . Asthma Daughter   . Anxiety disorder Daughter   . Depression Daughter      Current Outpatient Prescriptions:  .  hydrOXYzine (ATARAX/VISTARIL) 50 MG tablet, Take 50 mg by mouth 3 (three) times daily as needed., Disp: , Rfl:  .  QUEtiapine (SEROQUEL) 50 MG tablet, Take 50 mg by mouth at bedtime. Reported on 10/03/2015, Disp: , Rfl:  .  sertraline (ZOLOFT) 100 MG tablet, Take 150 mg by mouth daily. , Disp: , Rfl:   Review of Systems  Review of Systems  Constitutional: Negative for fever, chills, weight loss, malaise/fatigue and diaphoresis.  HENT: Negative for hearing loss, ear pain,  nosebleeds, congestion, sore throat, neck pain, tinnitus and ear discharge.   Eyes: Negative for blurred vision, double vision, photophobia, pain, discharge and redness.  Respiratory: Negative for cough, hemoptysis, sputum production, shortness of breath, wheezing and stridor.   Cardiovascular: Negative for chest pain, palpitations, orthopnea, claudication, leg swelling and PND.  Gastrointestinal: negative for abdominal pain. Negative for heartburn, nausea, vomiting, diarrhea, constipation, blood in stool and melena.  Genitourinary: Negative for dysuria, urgency, frequency, hematuria and flank pain.  Musculoskeletal: Negative for myalgias, back pain, joint pain and falls.  Skin: Negative for itching and rash.  Neurological: Negative for dizziness, tingling, tremors, sensory change, speech change, focal weakness, seizures, loss of consciousness, weakness and headaches.  Endo/Heme/Allergies: Negative for environmental allergies and polydipsia. Does not bruise/bleed easily.  Psychiatric/Behavioral: Negative for depression, suicidal ideas, hallucinations, memory loss and substance abuse. The patient is not nervous/anxious and does not have insomnia.        Objective:  Blood pressure 124/60, pulse 70, height 5' 4.5" (1.638 m), weight 186 lb 8 oz (84.6 kg), last menstrual period 07/18/2016.   Physical Exam  Vitals reviewed. Constitutional: She is oriented to person, place, and time. She appears well-developed and well-nourished.  HENT:  Head: Normocephalic and atraumatic.        Right Ear: External ear normal.  Left Ear: External ear normal.  Nose: Nose normal.  Mouth/Throat: Oropharynx is clear and moist.  Eyes: Conjunctivae and EOM are normal. Pupils are equal, round, and reactive to light. Right eye exhibits no discharge. Left eye exhibits no discharge. No scleral icterus.  Neck: Normal range of motion. Neck supple. No tracheal deviation present. No thyromegaly present.  Cardiovascular:  Normal rate, regular rhythm, normal heart sounds and intact distal pulses.  Exam reveals no gallop and no friction rub.   No murmur heard. Respiratory: Effort normal and breath sounds normal. No respiratory distress. She has no wheezes. She has no rales. She exhibits no tenderness.  GI: Soft. Bowel sounds are normal. She exhibits no distension and no mass. There is no tenderness. There is no rebound and no guarding.  Genitourinary:  Breasts no masses skin changes or nipple changes bilaterally      Vulva is normal without lesions Vagina is pink moist without discharge Cervix normal in appearance and pap is done Uterus is normal size shape and contour Adnexa is negative with normal sized ovaries   Musculoskeletal: Normal range of motion. She exhibits no edema and no tenderness.  Neurological: She is alert and oriented to person, place, and time. She has normal reflexes. She displays normal reflexes. No cranial nerve deficit. She exhibits normal muscle tone. Coordination normal.  Skin: Skin is warm and dry. No rash noted. No erythema. No pallor.  Psychiatric: She has a normal mood and affect. Her behavior is normal. Judgment and thought content normal.       Medications Ordered at today's visit: Meds ordered this encounter  Medications  . sertraline (ZOLOFT) 100 MG tablet    Sig: Take 150 mg by mouth daily.     Other orders placed at today's visit: Orders Placed This Encounter  Procedures  . POCT Urinalysis Dipstick      Assessment:    Healthy female exam.    Plan:    Contraception: none. Follow up in: 1 years.     Return in about 1 year (around 08/03/2017) for yearly, with Dr Elonda Husky.

## 2016-08-06 LAB — CYTOLOGY - PAP
Chlamydia: NEGATIVE
Diagnosis: NEGATIVE
HPV: DETECTED — AB
NEISSERIA GONORRHEA: NEGATIVE

## 2016-08-25 ENCOUNTER — Ambulatory Visit: Payer: Medicaid Other | Admitting: Obstetrics & Gynecology

## 2016-09-03 ENCOUNTER — Ambulatory Visit: Payer: Medicaid Other | Admitting: Obstetrics & Gynecology

## 2016-09-25 ENCOUNTER — Ambulatory Visit (INDEPENDENT_AMBULATORY_CARE_PROVIDER_SITE_OTHER): Payer: Medicaid Other | Admitting: Obstetrics & Gynecology

## 2016-09-25 ENCOUNTER — Encounter: Payer: Self-pay | Admitting: Obstetrics & Gynecology

## 2016-09-25 VITALS — BP 100/50 | HR 60 | Wt 187.4 lb

## 2016-09-25 DIAGNOSIS — N760 Acute vaginitis: Secondary | ICD-10-CM

## 2016-09-25 DIAGNOSIS — B9689 Other specified bacterial agents as the cause of diseases classified elsewhere: Secondary | ICD-10-CM

## 2016-09-25 MED ORDER — METRONIDAZOLE 500 MG PO TABS: 500.0000 mg | ORAL_TABLET | Freq: Two times a day (BID) | ORAL | 0 refills | Status: DC

## 2016-09-25 NOTE — Progress Notes (Signed)
       Chief Complaint  Patient presents with  . Vaginal Discharge    ? BV    Blood pressure (!) 100/50, pulse 60, weight 187 lb 6.4 oz (85 kg), last menstrual period 09/13/2016.  36 y.o. [id number] Patient's last menstrual period was 09/13/2016. The current method of family planning is none.  Subjective Vaginal discharge for 2weeks Itching no Irritation yes Odor yes Similar to previous yes  Previous treatment Metro gel  Objective Vulva:  normal appearing vulva with no masses, tenderness or lesions Vagina:  normal mucosa, thin grey discharge Cervix:  no cervical motion tenderness and no lesions Uterus:  normal size, contour, position, consistency, mobility, non-tender Adnexa: ovaries:present,       Pertinent ROS No burning with urination, frequency or urgency No nausea, vomiting or diarrhea Nor fever chills or other constitutional symptoms   Labs or studies Wet Prep:   A sample of vaginal discharge was obtained from the posterior fornix using a cotton swab. 2 drops of saline were placed on a slide and the cotton swab was immersed in the saline. Microscopic evaluation was performed and results were as follows:  negative for yeast  Positive for clue cells , consistent with Bacterial vaginosis Negative for trichomonas  Normal WBC population   Whiff test: Positive     Impression Diagnoses this Encounter::   ICD-9-CM ICD-10-CM   1. BV (bacterial vaginosis) 616.10 N76.0    041.9 B96.89     Established relevant diagnosis(es):   Plan/Recommendations: Meds ordered this encounter  Medications  . metroNIDAZOLE (FLAGYL) 500 MG tablet    Sig: Take 1 tablet (500 mg total) by mouth 2 (two) times daily.    Dispense:  14 tablet    Refill:  0    Labs or Scans Ordered: No orders of the defined types were placed in this encounter.   Management:: Metronidazole 500 BID for BV  Follow up Return if symptoms worsen or fail to improve.     All questions were  answered.  Past Medical History:  Diagnosis Date  . Anxiety   . Depression   . Heart murmur   . HPV (human papilloma virus) infection   . Hx of trichomoniasis     Past Surgical History:  Procedure Laterality Date  . CESAREAN SECTION    . elective abortion      OB History    Gravida Para Term Preterm AB Living   5 1   1 4 1    SAB TAB Ectopic Multiple Live Births   2       1      Allergies  Allergen Reactions  . Compazine [Prochlorperazine Edisylate] Other (See Comments)    "jittery"    Social History   Social History  . Marital status: Married    Spouse name: N/A  . Number of children: N/A  . Years of education: N/A   Social History Main Topics  . Smoking status: Never Smoker  . Smokeless tobacco: Never Used  . Alcohol use No  . Drug use: No  . Sexual activity: Yes    Birth control/ protection: None   Other Topics Concern  . None   Social History Narrative  . None    Family History  Problem Relation Age of Onset  . Other Father     collapsed lung; breathing issues  . Asthma Daughter   . Anxiety disorder Daughter   . Depression Daughter

## 2016-11-05 ENCOUNTER — Ambulatory Visit: Payer: Medicaid Other | Admitting: Adult Health

## 2016-11-11 ENCOUNTER — Ambulatory Visit: Payer: Medicaid Other | Admitting: Women's Health

## 2016-12-14 ENCOUNTER — Ambulatory Visit (INDEPENDENT_AMBULATORY_CARE_PROVIDER_SITE_OTHER): Payer: Medicaid Other | Admitting: Women's Health

## 2016-12-14 ENCOUNTER — Encounter: Payer: Self-pay | Admitting: Women's Health

## 2016-12-14 VITALS — BP 120/68 | HR 76 | Wt 188.0 lb

## 2016-12-14 DIAGNOSIS — B9689 Other specified bacterial agents as the cause of diseases classified elsewhere: Secondary | ICD-10-CM | POA: Diagnosis not present

## 2016-12-14 DIAGNOSIS — N760 Acute vaginitis: Secondary | ICD-10-CM | POA: Diagnosis not present

## 2016-12-14 DIAGNOSIS — N898 Other specified noninflammatory disorders of vagina: Secondary | ICD-10-CM | POA: Diagnosis not present

## 2016-12-14 LAB — POCT WET PREP (WET MOUNT)
CLUE CELLS WET PREP WHIFF POC: POSITIVE
Trichomonas Wet Prep HPF POC: ABSENT

## 2016-12-14 MED ORDER — METRONIDAZOLE 500 MG PO TABS: 500.0000 mg | ORAL_TABLET | Freq: Two times a day (BID) | ORAL | 0 refills | Status: DC

## 2016-12-14 NOTE — Progress Notes (Signed)
   [facility] Visit  Patient name: Cynthia Molina MRN [id number]  Date of birth: [birth year]  CC & HPI:  KAYLER BUCKHOLTZ is a 36 y.o. [id number] African American female presenting today for report of malodorous vag d/c, no itching/irritation. Gets BV often. Stopped using tampons, has changed soaps multiple times. Did you one for ph balance that seemed to help, but then switched again. Thought she may have left a tampon inside, but she couldn't feel anything. Partner who is '81 something' wants her to get pregnant. She is not on birth control, she has been pregnant multiple times prior to their relationship- she thinks it's him-she told him to get a box over the counter that can check his sperm count. She really doesn't want pregnancy.  Patient's last menstrual period was 12/05/2016 (exact date). The current method of family planning is none. Last pap 08/07/16 neg w/ +HRHPV  Pertinent History Reviewed:  Medical & Surgical Hx:   Past medical, surgical, family, and social history reviewed in electronic medical record Medications: Reviewed & Updated - see associated section Allergies: Reviewed in electronic medical record  Objective Findings:  Vitals: BP 120/68 (BP Location: Right Arm, Patient Position: Sitting, Cuff Size: Normal)   Pulse 76   Wt 188 lb (85.3 kg)   LMP 12/05/2016 (Exact Date)   BMI 31.77 kg/m  Body mass index is 31.77 kg/m.  Physical Examination: General appearance - alert, well appearing, and in no distress Pelvic - no tampon visible, cx normal, small amt malodorous d/c Bimanual: no tampon or foreign body palpable  Results for orders placed or performed in visit on 12/14/16 (from the past 24 hour(s))  POCT Wet Prep Lenard Forth [facility])   Collection Time: 12/14/16  3:47 PM  Result Value Ref Range   Source Wet Prep POC vaginal    WBC, Wet Prep HPF POC few    Bacteria Wet Prep HPF POC None (A) Few   BACTERIA WET PREP MORPHOLOGY POC     Clue Cells Wet Prep HPF POC Many  (A) None   Clue Cells Wet Prep Whiff POC Positive Whiff    Yeast Wet Prep HPF POC None    KOH Wet Prep POC     Trichomonas Wet Prep HPF POC Absent Absent     Assessment & Plan:  A:   BV  P:  Rx metronidazole 500mg  BID x 7d for BV, no sex or etoh while taking   Go back to ph balance soap  Discussed partner can get semen analysis if he & she really wants her to be pregnant  Return for after 3/9 for , Pap & physical.  Tawnya Crook CNM, [facility] 12/14/2016 3:47 PM

## 2016-12-14 NOTE — Patient Instructions (Signed)
Bacterial Vaginosis Bacterial vaginosis is a vaginal infection that occurs when the normal balance of bacteria in the vagina is disrupted. It results from an overgrowth of certain bacteria. This is the most common vaginal infection among women ages 15-44. Because bacterial vaginosis increases your risk for STIs (sexually transmitted infections), getting treated can help reduce your risk for chlamydia, gonorrhea, herpes, and HIV (human immunodeficiency virus). Treatment is also important for preventing complications in pregnant women, because this condition can cause an early (premature) delivery. What are the causes? This condition is caused by an increase in harmful bacteria that are normally present in small amounts in the vagina. However, the reason that the condition develops is not fully understood. What increases the risk? The following factors may make you more likely to develop this condition:  Having a new sexual partner or multiple sexual partners.  Having unprotected sex.  Douching.  Having an intrauterine device (IUD).  Smoking.  Drug and alcohol abuse.  Taking certain antibiotic medicines.  Being pregnant.  You cannot get bacterial vaginosis from toilet seats, bedding, swimming pools, or contact with objects around you. What are the signs or symptoms? Symptoms of this condition include:  Grey or white vaginal discharge. The discharge can also be watery or foamy.  A fish-like odor with discharge, especially after sexual intercourse or during menstruation.  Itching in and around the vagina.  Burning or pain with urination.  Some women with bacterial vaginosis have no signs or symptoms. How is this diagnosed? This condition is diagnosed based on:  Your medical history.  A physical exam of the vagina.  Testing a sample of vaginal fluid under a microscope to look for a large amount of bad bacteria or abnormal cells. Your health care provider may use a cotton swab  or a small wooden spatula to collect the sample.  How is this treated? This condition is treated with antibiotics. These may be given as a pill, a vaginal cream, or a medicine that is put into the vagina (suppository). If the condition comes back after treatment, a second round of antibiotics may be needed. Follow these instructions at home: Medicines  Take over-the-counter and prescription medicines only as told by your health care provider.  Take or use your antibiotic as told by your health care provider. Do not stop taking or using the antibiotic even if you start to feel better. General instructions  If you have a female sexual partner, tell her that you have a vaginal infection. She should see her health care provider and be treated if she has symptoms. If you have a female sexual partner, he does not need treatment.  During treatment: ? Avoid sexual activity until you finish treatment. ? Do not douche. ? Avoid alcohol as directed by your health care provider. ? Avoid breastfeeding as directed by your health care provider.  Drink enough water and fluids to keep your urine clear or pale yellow.  Keep the area around your vagina and rectum clean. ? Wash the area daily with warm water. ? Wipe yourself from front to back after using the toilet.  Keep all follow-up visits as told by your health care provider. This is important. How is this prevented?  Do not douche.  Wash the outside of your vagina with warm water only.  Use protection when having sex. This includes latex condoms and dental dams.  Limit how many sexual partners you have. To help prevent bacterial vaginosis, it is best to have sex with just   one partner (monogamous).  Make sure you and your sexual partner are tested for STIs.  Wear cotton or cotton-lined underwear.  Avoid wearing tight pants and pantyhose, especially during summer.  Limit the amount of alcohol that you drink.  Do not use any products that  contain nicotine or tobacco, such as cigarettes and e-cigarettes. If you need help quitting, ask your health care provider.  Do not use illegal drugs. Where to find more information:  Centers for Disease Control and Prevention: www.cdc.gov/std  American Sexual Health Association (ASHA): www.ashastd.org  U.S. Department of Health and Human Services, Office on Women's Health: www.womenshealth.gov/ or https://www.womenshealth.gov/a-z-topics/bacterial-vaginosis Contact a health care provider if:  Your symptoms do not improve, even after treatment.  You have more discharge or pain when urinating.  You have a fever.  You have pain in your abdomen.  You have pain during sex.  You have vaginal bleeding between periods. Summary  Bacterial vaginosis is a vaginal infection that occurs when the normal balance of bacteria in the vagina is disrupted.  Because bacterial vaginosis increases your risk for STIs (sexually transmitted infections), getting treated can help reduce your risk for chlamydia, gonorrhea, herpes, and HIV (human immunodeficiency virus). Treatment is also important for preventing complications in pregnant women, because the condition can cause an early (premature) delivery.  This condition is treated with antibiotic medicines. These may be given as a pill, a vaginal cream, or a medicine that is put into the vagina (suppository). This information is not intended to replace advice given to you by your health care provider. Make sure you discuss any questions you have with your health care provider. Document Released: 05/18/2005 Document Revised: 02/01/2016 Document Reviewed: 02/01/2016 Elsevier Interactive Patient Education  2017 Elsevier Inc.  

## 2017-02-17 ENCOUNTER — Other Ambulatory Visit: Payer: Self-pay | Admitting: Advanced Practice Midwife

## 2017-02-17 ENCOUNTER — Telehealth: Payer: Self-pay | Admitting: Women's Health

## 2017-02-17 MED ORDER — METRONIDAZOLE 500 MG PO TABS: 500.0000 mg | ORAL_TABLET | Freq: Two times a day (BID) | ORAL | 0 refills | Status: DC

## 2017-02-17 NOTE — Telephone Encounter (Signed)
Patient called stating that she know she has BV and she would like for kim or [facility] to call her in a medication for it. Pt states that the last time she came she told the Doctor that she had BV and that is exactly what she had. Please contact pt

## 2017-02-17 NOTE — Telephone Encounter (Signed)
Pt called stating that she thinks that she has BV again and would like medication sent in.  She states that she has a discharge with a mild odor; the same as before. I informed pt that I would send her request to a provider and she should check with her pharmacy later today.

## 2017-04-08 ENCOUNTER — Telehealth: Payer: Self-pay | Admitting: *Deleted

## 2017-04-08 MED ORDER — METRONIDAZOLE 500 MG PO TABS: 500.0000 mg | ORAL_TABLET | Freq: Two times a day (BID) | ORAL | 0 refills | Status: DC

## 2017-04-08 NOTE — Telephone Encounter (Signed)
Informed patient that antibiotic was sent to pharmacy but will need to be seen if it doesn't get better. Verbalized understanding.

## 2017-04-08 NOTE — Telephone Encounter (Signed)
Patient called stating she just finished her cycle and thinks she now has BV again. She has an odor with white discharge, no itching. She has been seen for this problem a couple of times in the past. She is requesting another antibiotic but will come in for appointment if necessary. Please advise.

## 2017-04-08 NOTE — Telephone Encounter (Signed)
Pt called w/ below complaints to RN:  "Patient called stating she just finished her cycle and thinks she now has BV again. She has an odor with white discharge, no itching. She has been seen for this problem a couple of times in the past. She is requesting another antibiotic but will come in for appointment if necessary. Please advise." Rx metronidazole 500mg  BID x 7d for BV, no sex or etoh while taking. If sx don't improve, make appt.  Roma Schanz, CNM, [facility] 04/08/2017 5:01 PM

## 2017-06-10 ENCOUNTER — Ambulatory Visit: Payer: Medicaid Other | Admitting: Advanced Practice Midwife

## 2017-06-10 ENCOUNTER — Encounter: Payer: Self-pay | Admitting: Advanced Practice Midwife

## 2017-06-10 ENCOUNTER — Other Ambulatory Visit: Payer: Self-pay

## 2017-06-10 VITALS — BP 126/72 | HR 66 | Ht 65.0 in | Wt 190.0 lb

## 2017-06-10 DIAGNOSIS — N761 Subacute and chronic vaginitis: Secondary | ICD-10-CM

## 2017-06-10 DIAGNOSIS — N760 Acute vaginitis: Secondary | ICD-10-CM

## 2017-06-10 DIAGNOSIS — N898 Other specified noninflammatory disorders of vagina: Secondary | ICD-10-CM

## 2017-06-10 DIAGNOSIS — B9689 Other specified bacterial agents as the cause of diseases classified elsewhere: Secondary | ICD-10-CM

## 2017-06-10 MED ORDER — CLINDAMYCIN PHOSPHATE 100 MG VA SUPP: 100.0000 mg | Freq: Every day | VAGINAL | 0 refills | Status: DC

## 2017-06-10 NOTE — Progress Notes (Signed)
CHIEF COMPLAINT/HPI:  37 y.o. female complains of white and copious vaginal discharge for a  week(s). Denies abnormal vaginal bleeding, significant pelvic pain or fever.  no itch, no irritation, fishy odor. No UTI symptoms.   Denies history of known exposure to STD or symptoms in partner.  Patient's last menstrual period was 05/27/2017.  Has  tried an OTC product. Cynthia Molina) which she says decreased the odor. Gets frequent BV (maily fishy odor/increased DC).  Knows about probiotics, thinks she is on the right path w/yogurt   Review of Systems  Constitutional: Negative for fever and chills Eyes: Negative for visual disturbances Respiratory: Negative for shortness of breath, dyspnea Cardiovascular: Negative for chest pain or palpitations  Gastrointestinal: Negative for vomiting, diarrhea and constipation Genitourinary: Negative for dysuria and urgency Musculoskeletal: Negative for back pain, joint pain, myalgias  Neurological: Negative for dizziness and headaches    Past Medical History: Past Medical History:  Diagnosis Date  . Anxiety   . Depression   . Heart murmur   . HPV (human papilloma virus) infection   . Hx of trichomoniasis     Past Surgical History: Past Surgical History:  Procedure Laterality Date  . CESAREAN SECTION    . elective abortion      Obstetrical History: OB History    Gravida Para Term Preterm AB Living   5 1   1 4 1    SAB TAB Ectopic Multiple Live Births   2       1      Social History: Social History   Socioeconomic History  . Marital status: Married    Spouse name: None  . Number of children: None  . Years of education: None  . Highest education level: None  Social Needs  . Financial resource strain: None  . Food insecurity - worry: None  . Food insecurity - inability: None  . Transportation needs - medical: None  . Transportation needs - non-medical: None  Occupational History  . None  Tobacco Use  . Smoking status: Never Smoker  .  Smokeless tobacco: Never Used  Substance and Sexual Activity  . Alcohol use: No  . Drug use: No  . Sexual activity: Yes    Birth control/protection: None  Other Topics Concern  . None  Social History Narrative  . None    Family History: Family History  Problem Relation Age of Onset  . Other Father        collapsed lung; breathing issues  . Asthma Daughter   . Anxiety disorder Daughter   . Depression Daughter     Allergies: Allergies  Allergen Reactions  . Compazine [Prochlorperazine Edisylate] Other (See Comments)    "jittery"        PHYSICAL EXAM: Physical Examination: General appearance - well appearing, and in no distress Mental status - alert, oriented to person, place, and time Chest:  Normal respiratory effort Heart - normal rate and regular rhythm Abdomen:  Soft, nontender Pelvic: SSE:  heavy discharge, wet prep no trich, small to mod clue, no wbc, no yeast Musculoskeletal:  Normal range of motion without pain Extremities:  No edema    Labs: No results found for this or any previous visit (from the past 24 hour(s)).   Assessment: Patient Active Problem List   Diagnosis Date Noted  . BV (bacterial vaginosis) 12/14/2016  . Vaginitis and vulvovaginitis, unspecified 07/06/2013  . 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 HPV test positive 12/20/2012     Plan:  Orders Placed This Encounter  Procedures  . NuSwab Vaginitis Plus (VG+)   Cleocin ovules  CRESENZO-DISHMAN,Lounell Schumacher 06/10/17 11:37 AM     `

## 2017-06-12 LAB — NUSWAB VAGINITIS PLUS (VG+)
ATOPOBIUM VAGINAE: HIGH {score} — AB
BVAB 2: HIGH Score — AB
CANDIDA GLABRATA, NAA: NEGATIVE
CHLAMYDIA TRACHOMATIS, NAA: NEGATIVE
Candida albicans, NAA: NEGATIVE
Megasphaera 1: HIGH Score — AB
NEISSERIA GONORRHOEAE, NAA: NEGATIVE
TRICH VAG BY NAA: NEGATIVE

## 2017-07-08 ENCOUNTER — Ambulatory Visit: Payer: Medicaid Other | Admitting: Adult Health

## 2017-08-04 ENCOUNTER — Other Ambulatory Visit: Payer: Self-pay | Admitting: Adult Health

## 2017-08-13 ENCOUNTER — Other Ambulatory Visit: Payer: Self-pay | Admitting: Adult Health

## 2017-09-01 ENCOUNTER — Ambulatory Visit: Payer: Medicaid Other | Admitting: Obstetrics and Gynecology

## 2017-09-09 ENCOUNTER — Other Ambulatory Visit: Payer: Self-pay | Admitting: Advanced Practice Midwife

## 2017-09-09 ENCOUNTER — Telehealth: Payer: Self-pay | Admitting: Adult Health

## 2017-09-09 MED ORDER — CLINDAMYCIN PHOSPHATE 100 MG VA SUPP: 100.0000 mg | Freq: Every day | VAGINAL | 3 refills | Status: DC

## 2017-09-09 NOTE — Telephone Encounter (Signed)
done

## 2017-09-09 NOTE — Progress Notes (Signed)
Cleocin ovules per request for BV

## 2017-09-09 NOTE — Telephone Encounter (Signed)
Spoke with pt. Pt thinks she has BV again. She said she gets this frequently. Has a vaginal discharge with odor. Pt wonders if you will order med. Thanks!! [place]

## 2017-09-09 NOTE — Telephone Encounter (Signed)
Patient called stating that she would like for [facility] to call her in the medication she takes for BV. Pt states that she would like a call back before we send in the medication. Please contact pt

## 2017-09-13 ENCOUNTER — Ambulatory Visit: Payer: Medicaid Other | Admitting: Adult Health

## 2017-09-13 ENCOUNTER — Other Ambulatory Visit (HOSPITAL_COMMUNITY)
Admission: RE | Admit: 2017-09-13 | Discharge: 2017-09-13 | Disposition: A | Discharge status: Home / Self Care | Payer: Medicaid Other | Source: Ambulatory Visit | Attending: Adult Health | Admitting: Adult Health

## 2017-09-13 ENCOUNTER — Encounter: Payer: Self-pay | Admitting: Adult Health

## 2017-09-13 VITALS — BP 120/58 | HR 92 | Ht 64.5 in | Wt 192.5 lb

## 2017-09-13 DIAGNOSIS — Z0001 Encounter for general adult medical examination with abnormal findings: Secondary | ICD-10-CM | POA: Diagnosis not present

## 2017-09-13 DIAGNOSIS — Z13220 Encounter for screening for lipoid disorders: Secondary | ICD-10-CM

## 2017-09-13 DIAGNOSIS — Z124 Encounter for screening for malignant neoplasm of cervix: Secondary | ICD-10-CM | POA: Insufficient documentation

## 2017-09-13 DIAGNOSIS — N920 Excessive and frequent menstruation with regular cycle: Secondary | ICD-10-CM | POA: Diagnosis not present

## 2017-09-13 DIAGNOSIS — R51 Headache: Secondary | ICD-10-CM | POA: Diagnosis not present

## 2017-09-13 DIAGNOSIS — Z01419 Encounter for gynecological examination (general) (routine) without abnormal findings: Secondary | ICD-10-CM

## 2017-09-13 DIAGNOSIS — R140 Abdominal distension (gaseous): Secondary | ICD-10-CM | POA: Diagnosis not present

## 2017-09-13 DIAGNOSIS — R87810 Cervical high risk human papillomavirus (HPV) DNA test positive: Secondary | ICD-10-CM | POA: Insufficient documentation

## 2017-09-13 DIAGNOSIS — Z113 Encounter for screening for infections with a predominantly sexual mode of transmission: Secondary | ICD-10-CM

## 2017-09-13 DIAGNOSIS — Z1329 Encounter for screening for other suspected endocrine disorder: Secondary | ICD-10-CM | POA: Diagnosis not present

## 2017-09-13 DIAGNOSIS — F32A Depression, unspecified: Secondary | ICD-10-CM

## 2017-09-13 DIAGNOSIS — F329 Major depressive disorder, single episode, unspecified: Secondary | ICD-10-CM

## 2017-09-13 NOTE — Progress Notes (Signed)
Patient ID: Cynthia Molina, female   DOB: February 10, 1981, 37 y.o.   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 PCP is Dr Berdine Addison.   Current Medications, Allergies, Past Medical History, Past Surgical History, Family History and Social History were reviewed in [employer] record.     Review of Systems: Patient denies any  hearing loss, fatigue, blurred vision, shortness of breath, chest pain, abdominal pain, problems with bowel movements, urination, or intercourse( not currently active). No joint pain or mood swings. +headaches lately +bloated +heavy bleeding 4/7 days with clots   Physical Exam:BP (!) 120/58 (BP Location: Right Arm, Patient Position: Sitting, Cuff Size: Normal)   Pulse 92   Ht 5' 4.5" (1.638 m)   Wt 192 lb 8 oz (87.3 kg)   LMP 08/22/2017   BMI 32.53 kg/m  General:  Well developed, well nourished, no acute distress Skin:  Warm and dry Neck:  Midline trachea, normal thyroid, good ROM, no lymphadenopathy Lungs; Clear to auscultation bilaterally Breast:  No dominant palpable mass, retraction, or nipple discharge Cardiovascular: Regular rate and rhythm Abdomen:  Soft, non tender, no hepatosplenomegaly Pelvic:  External genitalia is normal in appearance, no lesions.  The vagina is normal in appearance. Urethra has no lesions or masses. The cervix is bulbous and smooth. Pap with HPV and GC/CHL performed. Uterus is felt to be normal size, shape, and contour.  No adnexal masses or tenderness noted.Bladder is non tender, no masses felt. Rectal: Good sphincter tone, no polyps, or hemorrhoids felt.  Hemoccult negative. Extremities/musculoskeletal:  No swelling or varicosities noted, no clubbing or cyanosis Psych:  No mood changes, alert and cooperative,seems happy PHQ 9 score 18, is off meds, but sees [facility] will make appt,denies being  suicidal.  Impression: 1. Encounter for gynecological examination with Papanicolaou smear of cervix   2. Routine cervical smear   3. Depression, unspecified depression type   4. Menorrhagia with regular cycle   5. Screening cholesterol level   6. Screening for thyroid disorder   7. Screening examination for STD (sexually transmitted disease)       Plan: Check CBC,CMP,TSH and lipids,HIV and RPR Return in 1 week for GYN [place] Physical in 1 year Pap in 3 if normal Make appt with [facility] Review handout on menorrhagia

## 2017-09-13 NOTE — Patient Instructions (Signed)

## 2017-09-14 LAB — LIPID PANEL
CHOL/HDL RATIO: 2.8 ratio (ref 0.0–4.4)
Cholesterol, Total: 133 mg/dL (ref 100–199)
HDL: 48 mg/dL (ref >39)
LDL Calculated: 70 mg/dL (ref 0–99)
Triglycerides: 76 mg/dL (ref 0–149)
VLDL CHOLESTEROL CAL: 15 mg/dL (ref 5–40)

## 2017-09-14 LAB — COMPREHENSIVE METABOLIC PANEL
ALBUMIN: 4.3 g/dL (ref 3.5–5.5)
ALK PHOS: 63 IU/L (ref 39–117)
ALT: 12 IU/L (ref 0–32)
AST: 11 IU/L (ref 0–40)
Albumin/Globulin Ratio: 1.4 (ref 1.2–2.2)
BUN / CREAT RATIO: 8 — AB (ref 9–23)
BUN: 7 mg/dL (ref 6–20)
Bilirubin Total: 0.4 mg/dL (ref 0.0–1.2)
CHLORIDE: 102 mmol/L (ref 96–106)
CO2: 21 mmol/L (ref 20–29)
Calcium: 9.5 mg/dL (ref 8.7–10.2)
Creatinine, Ser: 0.87 mg/dL (ref 0.57–1.00)
GFR calc non Af Amer: 85 mL/min/{1.73_m2} (ref >59)
GFR, EST AFRICAN AMERICAN: 98 mL/min/{1.73_m2} (ref >59)
GLOBULIN, TOTAL: 3 g/dL (ref 1.5–4.5)
GLUCOSE: 116 mg/dL — AB (ref 65–99)
Potassium: 4.5 mmol/L (ref 3.5–5.2)
SODIUM: 137 mmol/L (ref 134–144)
TOTAL PROTEIN: 7.3 g/dL (ref 6.0–8.5)

## 2017-09-14 LAB — RPR: RPR: NONREACTIVE

## 2017-09-14 LAB — TSH: TSH: 1.32 u[IU]/mL (ref 0.450–4.500)

## 2017-09-14 LAB — HIV ANTIBODY (ROUTINE TESTING W REFLEX): HIV SCREEN 4TH GENERATION: NONREACTIVE

## 2017-09-14 LAB — CBC
HEMATOCRIT: 33.5 % — AB (ref 34.0–46.6)
HEMOGLOBIN: 10.9 g/dL — AB (ref 11.1–15.9)
MCH: 25.5 pg — AB (ref 26.6–33.0)
MCHC: 32.5 g/dL (ref 31.5–35.7)
MCV: 78 fL — ABNORMAL LOW (ref 79–97)
Platelets: 392 10*3/uL — ABNORMAL HIGH (ref 150–379)
RBC: 4.28 x10E6/uL (ref 3.77–5.28)
RDW: 15 % (ref 12.3–15.4)
WBC: 10.2 10*3/uL (ref 3.4–10.8)

## 2017-09-14 MED ORDER — CENTRUM PO CHEW: 1.0000 | CHEWABLE_TABLET | Freq: Every day | ORAL | Status: DC

## 2017-09-14 NOTE — Telephone Encounter (Signed)
Left message about labs and that hemoglobin is low, take OTC, iron or MVI with iron and get [place] next week

## 2017-09-15 LAB — CYTOLOGY - PAP
Chlamydia: NEGATIVE
Diagnosis: NEGATIVE
HPV (WINDOPATH): DETECTED — AB
NEISSERIA GONORRHEA: NEGATIVE

## 2017-09-16 ENCOUNTER — Telehealth: Payer: Self-pay | Admitting: Adult Health

## 2017-09-16 NOTE — Telephone Encounter (Signed)
Pt aware that pap is negative for malignancy and GC/CHL but +HPV, since this is second =HPV will get colpo, will schedule with Dr Elonda Husky

## 2017-09-21 ENCOUNTER — Other Ambulatory Visit: Payer: Medicaid Other

## 2017-09-27 ENCOUNTER — Ambulatory Visit (INDEPENDENT_AMBULATORY_CARE_PROVIDER_SITE_OTHER): Payer: Medicaid Other

## 2017-09-27 DIAGNOSIS — N920 Excessive and frequent menstruation with regular cycle: Secondary | ICD-10-CM

## 2017-09-27 DIAGNOSIS — D252 Subserosal leiomyoma of uterus: Secondary | ICD-10-CM | POA: Diagnosis not present

## 2017-09-27 DIAGNOSIS — R9389 Abnormal findings on diagnostic imaging of other specified body structures: Secondary | ICD-10-CM | POA: Diagnosis not present

## 2017-09-27 NOTE — Progress Notes (Signed)
PELVIC US TA/TV: heterogeneous anteverted uterus,anterior subserosal fibroid 1.3 x 2 x .9 cm,thickened endometrium 18.3 mm,normal ovaries bilat,no free fluid,no pain during ultrasound,ovaries appear mobile

## 2017-09-29 ENCOUNTER — Telehealth: Payer: Self-pay | Admitting: Adult Health

## 2017-09-29 ENCOUNTER — Encounter: Payer: Self-pay | Admitting: Adult Health

## 2017-09-29 DIAGNOSIS — D219 Benign neoplasm of connective and other soft tissue, unspecified: Secondary | ICD-10-CM

## 2017-09-29 DIAGNOSIS — R9389 Abnormal findings on diagnostic imaging of other specified body structures: Secondary | ICD-10-CM

## 2017-09-29 HISTORY — DX: Benign neoplasm of connective and other soft tissue, unspecified: D21.9

## 2017-09-29 HISTORY — DX: Abnormal findings on diagnostic imaging of other specified body structures: R93.89

## 2017-09-29 NOTE — Telephone Encounter (Signed)
Pt aware US shows small fibroid and endometrium 18.3, she declines OCs, has colpo 5/3 will get endometrial bx then with Dr Elonda Husky

## 2017-10-01 ENCOUNTER — Encounter: Payer: Self-pay | Admitting: Obstetrics & Gynecology

## 2017-10-01 ENCOUNTER — Ambulatory Visit: Payer: Medicaid Other | Admitting: Obstetrics & Gynecology

## 2017-10-01 VITALS — BP 128/70 | HR 72 | Ht 64.0 in | Wt 193.0 lb

## 2017-10-01 DIAGNOSIS — B977 Papillomavirus as the cause of diseases classified elsewhere: Secondary | ICD-10-CM

## 2017-10-01 DIAGNOSIS — Z3202 Encounter for pregnancy test, result negative: Secondary | ICD-10-CM

## 2017-10-01 DIAGNOSIS — R87810 Cervical high risk human papillomavirus (HPV) DNA test positive: Secondary | ICD-10-CM

## 2017-10-01 LAB — POCT URINE PREGNANCY: Preg Test, Ur: NEGATIVE

## 2017-10-01 MED ORDER — MEGESTROL ACETATE 40 MG PO TABS: ORAL_TABLET | ORAL | 3 refills | Status: DC

## 2017-10-01 NOTE — Progress Notes (Signed)
Colposcopy Procedure Note:  Colposcopy Procedure Note  Indications: Pap smear 1 months ago showed: +HPV. The prior pap showed same.  Prior cervical/vaginal disease: HPV related changes. Prior cervical treatment: no treatment.  Smoker:  No. New sexual partner:  No.  : time frame:  No.  History of abnormal Pap: yes  Procedure Details  The risks and benefits of the procedure and Written informed consent obtained.  Speculum placed in vagina and excellent visualization of cervix achieved, cervix swabbed x 3 with acetic acid solution.  Findings: Cervix: no visible lesions, no mosaicism, no punctation and no abnormal vasculature; SCJ visualized 360 degrees without lesions and no biopsies taken. Vaginal inspection: normal without visible lesions. Vulvar colposcopy: vulvar colposcopy not performed.  Specimens: none  Complications: none.  Plan: Repeat Pap 1 year  Meds ordered this encounter  Medications  . megestrol (MEGACE) 40 MG tablet    Sig: 3 tablets a day for 5 days, 2 tablets a day for 5 days then 1 tablet daily    Dispense:  45 tablet    Refill:  3   Sign BTL papers

## 2017-10-29 ENCOUNTER — Other Ambulatory Visit: Payer: Self-pay

## 2017-10-29 ENCOUNTER — Encounter: Payer: Self-pay | Admitting: Obstetrics & Gynecology

## 2017-10-29 ENCOUNTER — Ambulatory Visit: Payer: Medicaid Other | Admitting: Obstetrics & Gynecology

## 2017-10-29 VITALS — BP 122/78 | HR 58 | Ht 64.0 in | Wt 190.0 lb

## 2017-10-29 DIAGNOSIS — N920 Excessive and frequent menstruation with regular cycle: Secondary | ICD-10-CM | POA: Diagnosis not present

## 2017-10-29 NOTE — Progress Notes (Signed)
Chief Complaint  Patient presents with  . Follow-up      37 y.o. [id number] Patient's last menstrual period was 09/16/2017. The current method of family planning is oral progesterone-only contraceptive.  Outpatient Encounter Medications as of 10/29/2017  Medication Sig  . megestrol (MEGACE) 40 MG tablet 3 tablets a day for 5 days, 2 tablets a day for 5 days then 1 tablet daily   No facility-administered encounter medications on file as of 10/29/2017.     Subjective Cynthia Molina has been of megestrol for cycle regulation, she has not had menstrual or vaginal bleeding since being on it No cramping no complaints at all Past Medical History:  Diagnosis Date  . Anxiety   . Depression   . Fibroid 09/29/2017  . Heart murmur   . HPV (human papilloma virus) infection   . Hx of trichomoniasis   . Thickened endometrium 09/29/2017   Will get bx    Past Surgical History:  Procedure Laterality Date  . CESAREAN SECTION    . elective abortion      OB History    Gravida  5   Para  1   Term      Preterm  1   AB  4   Living  1     SAB  2   TAB      Ectopic      Multiple      Live Births  1           Allergies  Allergen Reactions  . Compazine [Prochlorperazine Edisylate] Other (See Comments)    "jittery"    Social History   Socioeconomic History  . Marital status: Married    Spouse name: Not on file  . Number of children: Not on file  . Years of education: Not on file  . Highest education level: Not on file  Occupational History  . Not on file  Social Needs  . Financial resource strain: Not on file  . Food insecurity:    Worry: Not on file    Inability: Not on file  . Transportation needs:    Medical: Not on file    Non-medical: Not on file  Tobacco Use  . Smoking status: Never Smoker  . Smokeless tobacco: Never Used  Substance and Sexual Activity  . Alcohol use: No  . Drug use: No  . Sexual activity: Yes    Birth control/protection:  None  Lifestyle  . Physical activity:    Days per week: Not on file    Minutes per session: Not on file  . Stress: Not on file  Relationships  . Social connections:    Talks on phone: Not on file    Gets together: Not on file    Attends religious service: Not on file    Active member of club or organization: Not on file    Attends meetings of clubs or organizations: Not on file    Relationship status: Not on file  Other Topics Concern  . Not on file  Social History Narrative  . Not on file    Family History  Problem Relation Age of Onset  . Other Father        collapsed lung; breathing issues  . Asthma Daughter   . Anxiety disorder Daughter   . Depression Daughter     Medications:       Current Outpatient Medications:  .  megestrol (MEGACE) 40 MG tablet, 3 tablets a  day for 5 days, 2 tablets a day for 5 days then 1 tablet daily, Disp: 45 tablet, Rfl: 3  Objective Blood pressure 122/78, pulse (!) 58, height 5\' 4"  (1.626 m), weight 190 lb (86.2 kg), last menstrual period 09/16/2017.  Gen WDWN NAD  Pertinent ROS No burning with urination, frequency or urgency No nausea, vomiting or diarrhea Nor fever chills or other constitutional symptoms   Labs or studies     Impression Diagnoses this Encounter::   ICD-10-CM   1. Menorrhagia with regular cycle N92.0     Established relevant diagnosis(es):   Plan/Recommendations: No orders of the defined types were placed in this encounter.   Labs or Scans Ordered: No orders of the defined types were placed in this encounter.   Management:: Pt will stay on megestrolfor 2 more months, then come off She has decided to not get a tubal ligation at ths time  Follow up Return if symptoms worsen or fail to improve, for April yearly.        Face to face time:  10 minutes  Greater than 50% of the visit time was spent in counseling and coordination of care with the patient.  The summary and outline of the  counseling and care coordination is summarized in the note above.   All questions were answered.    All questions were answered.

## 2017-11-19 ENCOUNTER — Telehealth: Payer: Self-pay | Admitting: Obstetrics & Gynecology

## 2017-11-19 NOTE — Telephone Encounter (Signed)
Pt called stating that she had been on megestrol to help with her bleeding. She states that she stopped taking the megestrol and this month her period has been on for 10 days. Advised that could be normal since she is not taking the megestrol anymore. Advised to call back if her bleeding continues for another week. Advised she could also resume the megestrol as prescribed. Pt verbalized understanding.

## 2017-11-19 NOTE — Telephone Encounter (Signed)
WOULD LIKE FOR A NURSE TO GIVE HER A CALL

## 2018-02-01 ENCOUNTER — Ambulatory Visit: Payer: Medicaid Other | Admitting: Obstetrics & Gynecology

## 2018-02-11 ENCOUNTER — Ambulatory Visit: Payer: Medicaid Other | Admitting: Obstetrics & Gynecology

## 2018-02-11 ENCOUNTER — Encounter: Payer: Self-pay | Admitting: *Deleted

## 2018-03-21 ENCOUNTER — Ambulatory Visit: Payer: Medicaid Other | Admitting: Adult Health

## 2018-03-23 ENCOUNTER — Telehealth: Payer: Self-pay | Admitting: Adult Health

## 2018-03-23 NOTE — Telephone Encounter (Signed)
Patient called stating that she would like a call back from Jennifer, Patient did not state the reason for the call. Please contact pt 

## 2018-03-24 MED ORDER — FLUCONAZOLE 150 MG PO TABS: ORAL_TABLET | ORAL | 1 refills | Status: DC

## 2018-03-24 NOTE — Telephone Encounter (Signed)
Had appt Monday and had to cancel, thinks she has yeast, and requests diflucan, will rx

## 2018-04-20 ENCOUNTER — Telehealth: Payer: Self-pay | Admitting: Adult Health

## 2018-04-20 MED ORDER — METRONIDAZOLE 500 MG PO TABS: 500.0000 mg | ORAL_TABLET | Freq: Two times a day (BID) | ORAL | 0 refills | Status: DC

## 2018-04-20 NOTE — Telephone Encounter (Signed)
Pt states that she is having trouble with her medicaid and can't get in for an appointment. She feels like she has BV and wanted to see if we can send a message in. She has thin discharge and fishy odor. Advised patient that I will send request to [facility] to see if we can call something in.

## 2018-04-20 NOTE — Telephone Encounter (Signed)
Will rx flagyl  

## 2018-04-20 NOTE — Telephone Encounter (Signed)
Patient called, would like to speak to a nurse regarding a personal issue, she would not elaborate.  [facility] at the [address]  [phone]

## 2018-04-23 ENCOUNTER — Emergency Department (HOSPITAL_COMMUNITY)
Admission: EM | Admit: 2018-04-23 | Discharge: 2018-04-23 | Disposition: A | Discharge status: Home / Self Care | Payer: Medicaid Other | Attending: Emergency Medicine | Admitting: Emergency Medicine

## 2018-04-23 ENCOUNTER — Encounter (HOSPITAL_COMMUNITY): Payer: Self-pay | Admitting: Emergency Medicine

## 2018-04-23 ENCOUNTER — Other Ambulatory Visit: Payer: Self-pay

## 2018-04-23 DIAGNOSIS — O23591 Infection of other part of genital tract in pregnancy, first trimester: Secondary | ICD-10-CM | POA: Insufficient documentation

## 2018-04-23 DIAGNOSIS — Z3A01 Less than 8 weeks gestation of pregnancy: Secondary | ICD-10-CM | POA: Insufficient documentation

## 2018-04-23 DIAGNOSIS — B373 Candidiasis of vulva and vagina: Secondary | ICD-10-CM

## 2018-04-23 DIAGNOSIS — N988 Other complications associated with artificial fertilization: Secondary | ICD-10-CM | POA: Diagnosis present

## 2018-04-23 DIAGNOSIS — Z3201 Encounter for pregnancy test, result positive: Secondary | ICD-10-CM | POA: Diagnosis not present

## 2018-04-23 DIAGNOSIS — B9689 Other specified bacterial agents as the cause of diseases classified elsewhere: Secondary | ICD-10-CM | POA: Insufficient documentation

## 2018-04-23 DIAGNOSIS — B3731 Acute candidiasis of vulva and vagina: Secondary | ICD-10-CM

## 2018-04-23 DIAGNOSIS — N898 Other specified noninflammatory disorders of vagina: Secondary | ICD-10-CM

## 2018-04-23 DIAGNOSIS — N760 Acute vaginitis: Secondary | ICD-10-CM

## 2018-04-23 HISTORY — DX: Other specified bacterial agents as the cause of diseases classified elsewhere: B96.89

## 2018-04-23 HISTORY — DX: Other specified bacterial agents as the cause of diseases classified elsewhere: N76.0

## 2018-04-23 LAB — URINALYSIS, ROUTINE W REFLEX MICROSCOPIC
BILIRUBIN URINE: NEGATIVE
Glucose, UA: NEGATIVE mg/dL
Hgb urine dipstick: NEGATIVE
Ketones, ur: NEGATIVE mg/dL
Nitrite: NEGATIVE
Protein, ur: NEGATIVE mg/dL
SPECIFIC GRAVITY, URINE: 1.021 (ref 1.005–1.030)
pH: 6 (ref 5.0–8.0)

## 2018-04-23 LAB — WET PREP, GENITAL
SPERM: NONE SEEN
TRICH WET PREP: NONE SEEN

## 2018-04-23 LAB — PREGNANCY, URINE: PREG TEST UR: POSITIVE — AB

## 2018-04-23 MED ORDER — CLOTRIMAZOLE 1 % VA CREA: 1.0000 | TOPICAL_CREAM | Freq: Every day | VAGINAL | 0 refills | Status: AC

## 2018-04-23 NOTE — Discharge Instructions (Addendum)
You need to follow-up with your OBGYN.  Return to the ER for vaginal bleeding or abdominal pain/cramping.  Your gonorrhea and chlamydia tests are still pending.  We will call you if either test returns positive.

## 2018-04-23 NOTE — ED Provider Notes (Signed)
[place] [facility] EMERGENCY DEPARTMENT Provider Note   CSN: [id number] Arrival date & time: 04/23/18  0007     History   Chief Complaint Chief Complaint  Patient presents with  . Vaginal Itching  . Vaginal Discharge    HPI Cynthia Molina is a 37 y.o. female.  Patient presents to the emergency department with a chief complaint of vaginal discharge and itching.  She reports having had the symptoms x1 week.  Her OB/GYN called her in a prescription of Flagyl.  She has been taking this as directed.  Denies any fevers, chills, abdominal pain, cramping, vaginal bleeding.  Denies any other associated symptoms.  The history is provided by the patient. No language interpreter was used.    Past Medical History:  Diagnosis Date  . Anxiety   . Bacterial vaginosis   . Depression   . Fibroid 09/29/2017  . Heart murmur   . HPV (human papilloma virus) infection   . Hx of trichomoniasis   . Thickened endometrium 09/29/2017   Will get bx    Patient Active Problem List   Diagnosis Date Noted  . Fibroid 09/29/2017  . Thickened endometrium 09/29/2017  . BV (bacterial vaginosis) 12/14/2016  . Vaginitis and vulvovaginitis, unspecified 07/06/2013  . 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 HPV test positive 12/20/2012    Past Surgical History:  Procedure Laterality Date  . CESAREAN SECTION    . elective abortion       OB History    Gravida  5   Para  1   Term      Preterm  1   AB  4   Living  1     SAB  2   TAB      Ectopic      Multiple      Live Births  1            Home Medications    Prior to Admission medications   Medication Sig Start Date End Date Taking? Authorizing Provider  fluconazole (DIFLUCAN) 150 MG tablet Take 1 now and 1 in 3 days 03/24/18   Estill Dooms, NP  megestrol (MEGACE) 40 MG tablet 3 tablets a day for 5 days, 2 tablets a day for 5 days then 1 tablet daily 10/01/17   Florian Buff, MD  metroNIDAZOLE (FLAGYL) 500 MG tablet  Take 1 tablet (500 mg total) by mouth 2 (two) times daily. 04/20/18   Estill Dooms, NP    Family History Family History  Problem Relation Age of Onset  . Other Father        collapsed lung; breathing issues  . Asthma Daughter   . Anxiety disorder Daughter   . Depression Daughter     Social History Social History   Tobacco Use  . Smoking status: Never Smoker  . Smokeless tobacco: Never Used  Substance Use Topics  . Alcohol use: No  . Drug use: No     Allergies   Compazine [prochlorperazine edisylate]   Review of Systems Review of Systems  All other systems reviewed and are negative.    Physical Exam Updated Vital Signs BP 117/69 (BP Location: Right Arm)   Pulse 79   Temp 98.7 F (37.1 C) (Oral)   Resp 16   LMP 03/22/2018   SpO2 100%   Physical Exam  Constitutional: She is oriented to person, place, and time. She appears well-developed and well-nourished.  HENT:  Head: Normocephalic and atraumatic.  Eyes: Pupils  are equal, round, and reactive to light. Conjunctivae and EOM are normal.  Neck: Normal range of motion. Neck supple.  Cardiovascular: Normal rate and regular rhythm. Exam reveals no gallop and no friction rub.  No murmur heard. Pulmonary/Chest: Effort normal and breath sounds normal. No respiratory distress. She has no wheezes. She has no rales. She exhibits no tenderness.  Abdominal: Soft. Bowel sounds are normal. She exhibits no distension and no mass. There is no tenderness. There is no rebound and no guarding.  Genitourinary:  Genitourinary Comments: Pelvic exam chaperoned by female ER tech, no right or left adnexal tenderness, no uterine tenderness, moderate thick white vaginal discharge, no bleeding, no CMT or friability, no foreign body, no injury to the external genitalia, no other significant findings   Musculoskeletal: Normal range of motion. She exhibits no edema or tenderness.  Neurological: She is alert and oriented to person,  place, and time.  Skin: Skin is warm and dry.  Psychiatric: She has a normal mood and affect. Her behavior is normal. Judgment and thought content normal.  Nursing note and vitals reviewed.    ED Treatments / Results  Labs (all labs ordered are listed, but only abnormal results are displayed) Labs Reviewed  URINALYSIS, ROUTINE W REFLEX MICROSCOPIC - Abnormal; Notable for the following components:      Result Value   Leukocytes, UA LARGE (*)    Bacteria, UA RARE (*)    All other components within normal limits  PREGNANCY, URINE - Abnormal; Notable for the following components:   Preg Test, Ur POSITIVE (*)    All other components within normal limits  WET PREP, GENITAL  GC/CHLAMYDIA PROBE AMP ([place]) NOT AT [facility]    EKG None  Radiology No results found.  Procedures Procedures (including critical care time)  Medications Ordered in ED Medications - No data to display   Initial Impression / Assessment and Plan / ED Course  I have reviewed the triage vital signs and the nursing notes.  Pertinent labs & imaging results that were available during my care of the patient were reviewed by me and considered in my medical decision making (see chart for details).     Patient with vaginal discharge and itching.  Urine pregnancy test positive.  Notify the patient of this.  No pelvic pain, no abdominal pain, no cramping, no vaginal bleeding.  Pelvic exam unremarkable save for some discharge.  She does have clue cells and yeast cells.  She states that she would like to wait for the gonorrhea and chlamydia test to return prior to being treated for this.  He can continue Flagyl and will prescribe clotrimazole cream.  Final Clinical Impressions(s) / ED Diagnoses   Final diagnoses:  Vaginal discharge  Less than [redacted] weeks gestation of pregnancy  BV (bacterial vaginosis)  Yeast vaginitis    ED Discharge Orders         Ordered    clotrimazole (GYNE-LOTRIMIN) 1 % vaginal cream  Daily  at bedtime     04/23/18 0422           Montine Circle, PA-C 04/23/18 0424    Horton, Barbette Hair, MD 04/23/18 [phone]

## 2018-04-23 NOTE — ED Triage Notes (Signed)
C/o vaginal itching and white vaginal discharge x 1 week.  States she was diagnosed with bacterial vaginosis 3 days ago and is taking antibiotics.

## 2018-04-23 NOTE — ED Notes (Signed)
Patient given gingerale and graham crackers 

## 2018-04-25 LAB — GC/CHLAMYDIA PROBE AMP (~~LOC~~) NOT AT ARMC
CHLAMYDIA, DNA PROBE: NEGATIVE
NEISSERIA GONORRHEA: NEGATIVE

## 2018-05-07 ENCOUNTER — Emergency Department (HOSPITAL_COMMUNITY)
Admission: EM | Admit: 2018-05-07 | Discharge: 2018-05-07 | Disposition: A | Discharge status: Home / Self Care | Payer: Medicaid Other | Attending: Emergency Medicine | Admitting: Emergency Medicine

## 2018-05-07 ENCOUNTER — Encounter (HOSPITAL_COMMUNITY): Payer: Self-pay

## 2018-05-07 ENCOUNTER — Emergency Department (HOSPITAL_COMMUNITY): Payer: Medicaid Other

## 2018-05-07 ENCOUNTER — Other Ambulatory Visit: Payer: Self-pay

## 2018-05-07 ENCOUNTER — Other Ambulatory Visit (HOSPITAL_COMMUNITY): Payer: Self-pay

## 2018-05-07 DIAGNOSIS — Z3A01 Less than 8 weeks gestation of pregnancy: Secondary | ICD-10-CM | POA: Insufficient documentation

## 2018-05-07 DIAGNOSIS — Z79899 Other long term (current) drug therapy: Secondary | ICD-10-CM | POA: Diagnosis not present

## 2018-05-07 DIAGNOSIS — O4690 Antepartum hemorrhage, unspecified, unspecified trimester: Secondary | ICD-10-CM

## 2018-05-07 DIAGNOSIS — O209 Hemorrhage in early pregnancy, unspecified: Secondary | ICD-10-CM | POA: Insufficient documentation

## 2018-05-07 LAB — URINALYSIS, ROUTINE W REFLEX MICROSCOPIC
Bilirubin Urine: NEGATIVE
Glucose, UA: NEGATIVE mg/dL
Ketones, ur: NEGATIVE mg/dL
Nitrite: NEGATIVE
Protein, ur: NEGATIVE mg/dL
Specific Gravity, Urine: 1.006 (ref 1.005–1.030)
pH: 6 (ref 5.0–8.0)

## 2018-05-07 LAB — CBC WITH DIFFERENTIAL/PLATELET
Abs Immature Granulocytes: 0.02 10*3/uL (ref 0.00–0.07)
Basophils Absolute: 0.1 10*3/uL (ref 0.0–0.1)
Basophils Relative: 1 %
EOS PCT: 3 %
Eosinophils Absolute: 0.3 10*3/uL (ref 0.0–0.5)
HCT: 33.3 % — ABNORMAL LOW (ref 36.0–46.0)
HEMOGLOBIN: 9.8 g/dL — AB (ref 12.0–15.0)
Immature Granulocytes: 0 %
Lymphocytes Relative: 23 %
Lymphs Abs: 2 10*3/uL (ref 0.7–4.0)
MCH: 24 pg — ABNORMAL LOW (ref 26.0–34.0)
MCHC: 29.4 g/dL — ABNORMAL LOW (ref 30.0–36.0)
MCV: 81.4 fL (ref 80.0–100.0)
Monocytes Absolute: 0.5 10*3/uL (ref 0.1–1.0)
Monocytes Relative: 6 %
Neutro Abs: 6.1 10*3/uL (ref 1.7–7.7)
Neutrophils Relative %: 67 %
Platelets: 371 10*3/uL (ref 150–400)
RBC: 4.09 MIL/uL (ref 3.87–5.11)
RDW: 16.2 % — ABNORMAL HIGH (ref 11.5–15.5)
WBC: 9 10*3/uL (ref 4.0–10.5)
nRBC: 0 % (ref 0.0–0.2)

## 2018-05-07 LAB — BASIC METABOLIC PANEL
Anion gap: 8 (ref 5–15)
BUN: 6 mg/dL (ref 6–20)
CO2: 23 mmol/L (ref 22–32)
Calcium: 8.4 mg/dL — ABNORMAL LOW (ref 8.9–10.3)
Chloride: 105 mmol/L (ref 98–111)
Creatinine, Ser: 0.74 mg/dL (ref 0.44–1.00)
GFR calc Af Amer: >60 mL/min (ref >60)
GFR calc non Af Amer: >60 mL/min (ref >60)
Glucose, Bld: 104 mg/dL — ABNORMAL HIGH (ref 70–99)
Potassium: 3.9 mmol/L (ref 3.5–5.1)
Sodium: 136 mmol/L (ref 135–145)

## 2018-05-07 LAB — HCG, QUANTITATIVE, PREGNANCY: hCG, Beta Chain, Quant, S: 685 m[IU]/mL — ABNORMAL HIGH (ref <5)

## 2018-05-07 LAB — WET PREP, GENITAL
Clue Cells Wet Prep HPF POC: NONE SEEN
Sperm: NONE SEEN
Trich, Wet Prep: NONE SEEN
Yeast Wet Prep HPF POC: NONE SEEN

## 2018-05-07 NOTE — Discharge Instructions (Signed)
Please follow up with OBGYN Return if you are worsening

## 2018-05-07 NOTE — ED Triage Notes (Signed)
Pt reports vaginal bleeding (spotting) this morning when she went to wipe. Pt is approximately [redacted] weeks pregnant. No abd cramping noted, no other symptoms.

## 2018-05-07 NOTE — ED Provider Notes (Signed)
[place] EMERGENCY DEPARTMENT Provider Note   CSN: [id number] Arrival date & time: 05/07/18  1150    History   Chief Complaint Chief Complaint  Patient presents with  . Vaginal Bleeding    HPI Cynthia Molina is a 37 y.o. G16P1 female who presents with vaginal bleeding. PMH significant for anxiety, depression, BV, recent yeast infection, HPV, hx of trich, fibroids. She states that this morning when she went to the bathroom and wiped she saw a small amount of blood on the tissue. This concerned her so she decided to come to the ED. She was seen here on 11/23 and treated for BV and a yeast infection. Her symptoms from that improved but she still feels "irritated down there". No dysuria, vaginal discharge, or heavy vaginal bleeding. No abdominal/pelvic pain, N/V. She has an OBGYN appt in [place] but it's not till next week. She estimates her LMP was Oct 20.  HPI  Past Medical History:  Diagnosis Date  . Anxiety   . Bacterial vaginosis   . Depression   . Fibroid 09/29/2017  . Heart murmur   . HPV (human papilloma virus) infection   . Hx of trichomoniasis   . Thickened endometrium 09/29/2017   Will get bx    Patient Active Problem List   Diagnosis Date Noted  . Fibroid 09/29/2017  . Thickened endometrium 09/29/2017  . BV (bacterial vaginosis) 12/14/2016  . Vaginitis and vulvovaginitis, unspecified 07/06/2013  . 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 HPV test positive 12/20/2012    Past Surgical History:  Procedure Laterality Date  . CESAREAN SECTION    . elective abortion       OB History    Gravida  5   Para  1   Term      Preterm  1   AB  4   Living  1     SAB  2   TAB      Ectopic      Multiple      Live Births  1            Home Medications    Prior to Admission medications   Medication Sig Start Date End Date Taking? Authorizing Provider  fluconazole (DIFLUCAN) 150 MG tablet Take 1 now and 1 in 3 days 03/24/18   Estill Dooms, NP  megestrol (MEGACE) 40 MG tablet 3 tablets a day for 5 days, 2 tablets a day for 5 days then 1 tablet daily 10/01/17   Florian Buff, MD  metroNIDAZOLE (FLAGYL) 500 MG tablet Take 1 tablet (500 mg total) by mouth 2 (two) times daily. 04/20/18   Estill Dooms, NP    Family History Family History  Problem Relation Age of Onset  . Other Father        collapsed lung; breathing issues  . Asthma Daughter   . Anxiety disorder Daughter   . Depression Daughter     Social History Social History   Tobacco Use  . Smoking status: Never Smoker  . Smokeless tobacco: Never Used  Substance Use Topics  . Alcohol use: No  . Drug use: No     Allergies   Compazine [prochlorperazine edisylate]   Review of Systems Review of Systems  Constitutional: Negative for fever.  Respiratory: Negative for shortness of breath.   Cardiovascular: Negative for chest pain.  Gastrointestinal: Negative for abdominal pain, nausea and vomiting.  Genitourinary: Positive for vaginal bleeding and vaginal pain. Negative for dysuria,  frequency, pelvic pain and vaginal discharge.  All other systems reviewed and are negative.    Physical Exam Updated Vital Signs BP 120/63 (BP Location: Right Arm)   Pulse 80   Temp 98 F (36.7 C) (Oral)   Resp 16   Ht 5\' 5"  (1.651 m)   Wt 81.6 kg   SpO2 100%   BMI 29.95 kg/m   Physical Exam  Constitutional: She is oriented to person, place, and time. She appears well-developed and well-nourished. No distress.  Calm and cooperative  HENT:  Head: Normocephalic and atraumatic.  Eyes: Pupils are equal, round, and reactive to light. Conjunctivae are normal. Right eye exhibits no discharge. Left eye exhibits no discharge. No scleral icterus.  Neck: Normal range of motion.  Cardiovascular: Normal rate and regular rhythm.  Pulmonary/Chest: Effort normal and breath sounds normal. No respiratory distress.  Abdominal: Soft. Bowel sounds are normal. She exhibits  no distension. There is no tenderness.  Genitourinary:  Genitourinary Comments: Pelvic: No inguinal lymphadenopathy or inguinal hernia noted. Normal external genitalia. No pain with speculum insertion. Closed cervical os with normal appearance - no rash or lesions. Clear discharge with small amt of blood streaks oozing from cervix. On bimanual examination no adnexal tenderness or cervical motion tenderness. Chaperone present during exam.   Neurological: She is alert and oriented to person, place, and time.  Skin: Skin is warm and dry.  Psychiatric: She has a normal mood and affect. Her behavior is normal.  Nursing note and vitals reviewed.    ED Treatments / Results  Labs (all labs ordered are listed, but only abnormal results are displayed) Labs Reviewed  URINALYSIS, ROUTINE W REFLEX MICROSCOPIC - Abnormal; Notable for the following components:      Result Value   APPearance HAZY (*)    Hgb urine dipstick MODERATE (*)    Leukocytes, UA MODERATE (*)    Bacteria, UA RARE (*)    All other components within normal limits  BASIC METABOLIC PANEL - Abnormal; Notable for the following components:   Glucose, Bld 104 (*)    Calcium 8.4 (*)    All other components within normal limits  CBC WITH DIFFERENTIAL/PLATELET - Abnormal; Notable for the following components:   Hemoglobin 9.8 (*)    HCT 33.3 (*)    MCH 24.0 (*)    MCHC 29.4 (*)    RDW 16.2 (*)    All other components within normal limits  WET PREP, GENITAL  HCG, QUANTITATIVE, PREGNANCY  RPR  HIV ANTIBODY (ROUTINE TESTING W REFLEX)  GC/CHLAMYDIA PROBE AMP ([place]) NOT AT [facility]    EKG None  Radiology No results found.  Procedures Procedures (including critical care time)  Medications Ordered in ED Medications - No data to display   Initial Impression / Assessment and Plan / ED Course  I have reviewed the triage vital signs and the nursing notes.  Pertinent labs & imaging results that were available during my care  of the patient were reviewed by me and considered in my medical decision making (see chart for details).  37 year old female with vaginal spotting starting this morning.  Her vital signs are normal.  Her abdomen is nontender.  On her pelvic exam she has no tenderness but very small streaks of blood in her discharge.  Blood work was obtained and she has anemia which is around her baseline.  Her beta hCG is 685 which is lower than I would suspect for 5 weeks pregnancy.  A pelvic and transvaginal  ultrasound was obtained which showed possible IUP.  Patient was informed of results.  She has a OB/GYN visit scheduled for this upcoming Tuesday.  She was given her results from today and given strict return precautions.  Final Clinical Impressions(s) / ED Diagnoses   Final diagnoses:  Vaginal bleeding in pregnancy    ED Discharge Orders    None       Recardo Evangelist, PA-C 05/07/18 1547    Dorie Rank, MD 05/07/18 [phone]

## 2018-05-08 LAB — RPR: RPR Ser Ql: NONREACTIVE

## 2018-05-08 LAB — HIV ANTIBODY (ROUTINE TESTING W REFLEX): HIV Screen 4th Generation wRfx: NONREACTIVE

## 2018-05-09 ENCOUNTER — Telehealth: Payer: Self-pay | Admitting: *Deleted

## 2018-05-09 LAB — GC/CHLAMYDIA PROBE AMP (~~LOC~~) NOT AT ARMC
CHLAMYDIA, DNA PROBE: NEGATIVE
Neisseria Gonorrhea: NEGATIVE

## 2018-05-09 NOTE — Telephone Encounter (Signed)
Patient called with complaints of continued heavy vaginal bleeding requiring her to wear a pad, not passing any clots.  She was seen in the ER on 12/7 for the bleeding and is scheduled for a visit tomorrow for a pregnancy test.  HCG was done. Please advise for further plan.

## 2018-05-09 NOTE — Telephone Encounter (Signed)
Patient informed per JAG, she needs repeat HCG and f/u next week.  Pt to come tomorrow for lab.

## 2018-05-10 ENCOUNTER — Ambulatory Visit: Payer: Medicaid Other | Admitting: Adult Health

## 2018-05-10 ENCOUNTER — Other Ambulatory Visit: Payer: Medicaid Other

## 2018-05-10 DIAGNOSIS — O200 Threatened abortion: Secondary | ICD-10-CM

## 2018-05-11 ENCOUNTER — Telehealth: Payer: Self-pay | Admitting: *Deleted

## 2018-05-11 ENCOUNTER — Telehealth: Payer: Self-pay | Admitting: Adult Health

## 2018-05-11 LAB — BETA HCG QUANT (REF LAB): hCG Quant: 225 m[IU]/mL

## 2018-05-11 NOTE — Telephone Encounter (Signed)
Patient called, requested to leave a message for [facility].  Would like to know if she can take fluconazole while she is going through the miscarriage.   [phone]

## 2018-05-11 NOTE — Telephone Encounter (Signed)
Patient informed HCG is dropping and does indicate a miscarriage.  Will need repeat next week.  Will come next Wednesday.

## 2018-05-12 ENCOUNTER — Telehealth: Payer: Self-pay | Admitting: Adult Health

## 2018-05-12 NOTE — Telephone Encounter (Signed)
Ok to use diflucan

## 2018-05-12 NOTE — Telephone Encounter (Signed)
NO VM but if she calls, she can take diflucan

## 2018-05-18 ENCOUNTER — Other Ambulatory Visit: Payer: Medicaid Other

## 2018-05-18 DIAGNOSIS — O039 Complete or unspecified spontaneous abortion without complication: Secondary | ICD-10-CM

## 2018-05-19 ENCOUNTER — Telehealth: Payer: Self-pay | Admitting: *Deleted

## 2018-05-19 LAB — BETA HCG QUANT (REF LAB): hCG Quant: 3 m[IU]/mL

## 2018-05-19 NOTE — Telephone Encounter (Signed)
Patient informed she does not need anymore HCG labs as miscarriage has passed.  Verbalized understanding.

## 2018-05-27 ENCOUNTER — Telehealth: Payer: Self-pay | Admitting: *Deleted

## 2018-05-27 ENCOUNTER — Encounter: Payer: Self-pay | Admitting: Obstetrics and Gynecology

## 2018-05-27 ENCOUNTER — Ambulatory Visit: Payer: Medicaid Other | Admitting: Obstetrics and Gynecology

## 2018-05-27 VITALS — BP 131/77 | HR 89 | Ht 65.0 in | Wt 189.0 lb

## 2018-05-27 DIAGNOSIS — N898 Other specified noninflammatory disorders of vagina: Secondary | ICD-10-CM | POA: Diagnosis not present

## 2018-05-27 DIAGNOSIS — Z3202 Encounter for pregnancy test, result negative: Secondary | ICD-10-CM

## 2018-05-27 LAB — POCT URINE PREGNANCY: Preg Test, Ur: NEGATIVE

## 2018-05-27 MED ORDER — METRONIDAZOLE 0.75 % VA GEL: 1.0000 | Freq: Every day | VAGINAL | 1 refills | Status: DC

## 2018-05-27 NOTE — Telephone Encounter (Signed)
Pt called back requesting something be called in for BV. Please advise. Thanks!!

## 2018-05-27 NOTE — Addendum Note (Signed)
Addended by: Linton Rump on: 05/27/2018 12:36 PM   Modules accepted: Orders

## 2018-05-27 NOTE — Progress Notes (Signed)
Pt recently completed SAB. Has noted a yellowish vaginal discharge for the last several days Last IC a few days ago Same partner No contraception. Ws Tx for BV and yeast first of December  PE AF VSs Lungs clear Heart RRR Abd soft + BS GU Nl EGBUS thick yellow discharge cervix no lesion uterus small mobile non tender no masses  A/P Vaginal discharge  Swab for testing collected Tx as per test esults

## 2018-05-27 NOTE — Addendum Note (Signed)
Addended by: Chancy Milroy on: 05/27/2018 12:57 PM   Modules accepted: Orders

## 2018-05-30 ENCOUNTER — Telehealth: Payer: Self-pay | Admitting: *Deleted

## 2018-05-30 LAB — NUSWAB VAGINITIS PLUS (VG+)
Candida albicans, NAA: POSITIVE — AB
Candida glabrata, NAA: NEGATIVE
Chlamydia trachomatis, NAA: NEGATIVE
Neisseria gonorrhoeae, NAA: NEGATIVE
Trich vag by NAA: NEGATIVE

## 2018-05-30 MED ORDER — FLUCONAZOLE 150 MG PO TABS: 150.0000 mg | ORAL_TABLET | Freq: Once | ORAL | 1 refills | Status: AC

## 2018-05-30 NOTE — Telephone Encounter (Signed)
Called patient to inform swab showed yeast along with BV. Diflucan sent to pharmacy. Pt verbalized understanding.

## 2018-06-27 ENCOUNTER — Telehealth: Payer: Self-pay | Admitting: Adult Health

## 2018-06-27 MED ORDER — FLUCONAZOLE 150 MG PO TABS: ORAL_TABLET | ORAL | 1 refills | Status: DC

## 2018-06-27 NOTE — Telephone Encounter (Signed)
Patient called, wants to speak to [facility].  Stated that she has something going on that she thinks is related to the miscarriage.  [phone]

## 2018-06-27 NOTE — Telephone Encounter (Signed)
Pt called stating that she feels like she has another yeast infection and would like a prescription sent in. Pt c/o discharge and itching. No odor. Advised that I would send her request to a provider and she could check with her pharmacy later today. Pt verbalized understanding.

## 2018-06-27 NOTE — Telephone Encounter (Signed)
Will rx diflucan  

## 2018-07-05 ENCOUNTER — Telehealth: Payer: Self-pay | Admitting: Adult Health

## 2018-07-05 NOTE — Telephone Encounter (Signed)
Spoke with pt. Pt is requesting a refill on Diflucan. Pt feels like she still has a small amount of yeast.  Please advise. Thanks!! [place]

## 2018-07-05 NOTE — Telephone Encounter (Signed)
Pt needs to have a nurse give her a call today. Wants to see if she can get script for yeast infection. [facility] in [place],

## 2018-07-05 NOTE — Telephone Encounter (Signed)
Voice mail not set up @ 3:59 pm. JSY

## 2018-07-06 MED ORDER — FLUCONAZOLE 150 MG PO TABS: ORAL_TABLET | ORAL | 1 refills | Status: DC

## 2018-07-06 NOTE — Telephone Encounter (Signed)
Refilled diflucan

## 2018-07-07 ENCOUNTER — Ambulatory Visit: Payer: Medicaid Other | Admitting: Adult Health

## 2018-07-20 ENCOUNTER — Ambulatory Visit: Payer: Medicaid Other | Admitting: Adult Health

## 2018-07-20 ENCOUNTER — Encounter: Payer: Self-pay | Admitting: Adult Health

## 2018-07-20 VITALS — BP 114/70 | HR 81 | Ht 65.0 in | Wt 195.0 lb

## 2018-07-20 DIAGNOSIS — N898 Other specified noninflammatory disorders of vagina: Secondary | ICD-10-CM | POA: Diagnosis not present

## 2018-07-20 DIAGNOSIS — B373 Candidiasis of vulva and vagina: Secondary | ICD-10-CM

## 2018-07-20 DIAGNOSIS — B3731 Acute candidiasis of vulva and vagina: Secondary | ICD-10-CM

## 2018-07-20 LAB — POCT WET PREP (WET MOUNT)
Clue Cells Wet Prep Whiff POC: NEGATIVE
WBC, Wet Prep HPF POC: POSITIVE

## 2018-07-20 MED ORDER — FLUCONAZOLE 100 MG PO TABS: ORAL_TABLET | ORAL | 0 refills | Status: DC

## 2018-07-20 NOTE — Progress Notes (Signed)
Patient ID: Cynthia Molina, female   DOB: [birth year], 38 y.o.   MRN: [id number] History of Present Illness:  Kathie is a 38 year old black female in complaining of vaginal discharge and itching.Has been treated for BV and yeast recently.   Current Medications, Allergies, Past Medical History, Past Surgical History, Family History and Social History were reviewed in [employer] record.     Review of Systems:  +vaginal discharge and itching, has been treated for BV and yeast  Had miscarriage in December    Physical Exam:BP 114/70 (BP Location: Left Arm, Patient Position: Sitting, Cuff Size: Normal)   Pulse 81   Ht 5\' 5"  (1.651 m)   Wt 195 lb (88.5 kg)   BMI 32.45 kg/m  General:  Well developed, well nourished, no acute distress Skin:  Warm and dry Pelvic:  External genitalia is normal in appearance, no lesions.  The vagina is normal in appearance. Urethra has no lesions or masses. The cervix is bulbous.  Uterus is felt to be normal size, shape, and contour.  No adnexal masses or tenderness noted.Bladder is non tender, no masses felt. Nuswab obtained, wet prep:+yeast, cleaned discharge out with Q TIP. Psych:  No mood changes, alert and cooperative,seems happy Fall risk is low. Examination chaperoned by Estill Bamberg Rash LPN.  Impression: 1. Vaginal yeast infection   2. Vaginal discharge   3. Vagina itching       Plan: Nuswab sent Meds ordered this encounter  Medications  . fluconazole (DIFLUCAN) 100 MG tablet    Sig: Take 1 daily for 10 days    Dispense:  10 tablet    Refill:  0    Order Specific Question:   Supervising Provider    Answer:   Tania Ade H [[id number]]  No sex Use bar soap F/U in 12 days if still itching, may use gentian violet

## 2018-07-23 LAB — NUSWAB VAGINITIS PLUS (VG+)
Candida albicans, NAA: POSITIVE — AB
Candida glabrata, NAA: NEGATIVE
Chlamydia trachomatis, NAA: NEGATIVE
Neisseria gonorrhoeae, NAA: NEGATIVE
Trich vag by NAA: NEGATIVE

## 2018-07-25 ENCOUNTER — Telehealth: Payer: Self-pay | Admitting: Adult Health

## 2018-07-25 NOTE — Telephone Encounter (Signed)
Pt aware Nuswab +yeast, diflucan should take care of it, keep appt 3/2

## 2018-08-01 ENCOUNTER — Encounter: Payer: Self-pay | Admitting: Adult Health

## 2018-08-01 ENCOUNTER — Ambulatory Visit: Payer: Medicaid Other | Admitting: Adult Health

## 2018-08-01 ENCOUNTER — Other Ambulatory Visit: Payer: Self-pay

## 2018-08-01 VITALS — BP 113/69 | HR 74 | Ht 64.0 in | Wt 191.0 lb

## 2018-08-01 DIAGNOSIS — B3731 Acute candidiasis of vulva and vagina: Secondary | ICD-10-CM

## 2018-08-01 DIAGNOSIS — N898 Other specified noninflammatory disorders of vagina: Secondary | ICD-10-CM

## 2018-08-01 DIAGNOSIS — B373 Candidiasis of vulva and vagina: Secondary | ICD-10-CM | POA: Diagnosis not present

## 2018-08-01 MED ORDER — TRIAMCINOLONE ACETONIDE 0.1 % EX CREA: 1.0000 "application " | TOPICAL_CREAM | Freq: Two times a day (BID) | CUTANEOUS | 0 refills | Status: DC

## 2018-08-01 NOTE — Progress Notes (Signed)
Patient ID: Cynthia Molina, female   DOB: [birth year], 38 y.o.   MRN: [id number] History of Present Illness:    Current Medications, Allergies, Past Medical History, Past Surgical History, Family History and Social History were reviewed in [employer] record.     Review of Systems: Itching is better Discharge is     Physical Exam:BP 113/69 (BP Location: Right Arm, Patient Position: Sitting, Cuff Size: Normal)   Pulse 74   Ht 5\' 4"  (1.626 m)   Wt 191 lb (86.6 kg)   LMP 07/06/2018   BMI 32.79 kg/m  General:  Well developed, well nourished, no acute distress Skin:  Warm and dry Pelvic:  External genitalia is normal in appearance, has several round scaly areas left inner thigh, no itching.  The vagina is normal in appearance.Has white discharge. Urethra has no lesions or masses. The cervix is bulbous.  Uterus is felt to be normal size, shape, and contour.  No adnexal masses or tenderness noted.Bladder is non tender, no masses felt.Vagina painted with gentian violet.  Psych:  No mood changes, alert and cooperative,seems happy Examination chaperoned by Shela Nevin RN Will rx kenalog for area inner thigh. Pat dry after bath.   Impression: 1. Vaginal yeast infection   2. Vaginal discharge   3. Vagina itching       Plan: Meds ordered this encounter  Medications  . triamcinolone cream (KENALOG) 0.1 %    Sig: Apply 1 application topically 2 (two) times daily.    Dispense:  30 g    Refill:  0    Order Specific Question:   Supervising Provider    Answer:   Elonda Husky, LUTHER H [[id number]]  F/U in 2 weeks

## 2018-08-15 ENCOUNTER — Other Ambulatory Visit: Payer: Self-pay

## 2018-08-15 ENCOUNTER — Ambulatory Visit: Payer: Medicaid Other | Admitting: Adult Health

## 2018-08-15 ENCOUNTER — Encounter: Payer: Self-pay | Admitting: Adult Health

## 2018-08-15 VITALS — BP 101/58 | HR 80 | Ht 64.0 in | Wt 193.0 lb

## 2018-08-15 DIAGNOSIS — B379 Candidiasis, unspecified: Secondary | ICD-10-CM | POA: Diagnosis not present

## 2018-08-15 NOTE — Progress Notes (Signed)
Patient ID: LAURI PURDUM, female   DOB: [birth year], 38 y.o.   MRN: [id number] History of Present Illness:  Caragh is a 38 year old black female, back in follow up on being treated for yeast and is much better, no discharge or itching.   Current Medications, Allergies, Past Medical History, Past Surgical History, Family History and Social History were reviewed in [employer] record.     Review of Systems: No discharge or itching    Physical Exam:BP (!) 101/58 (BP Location: Right Arm, Patient Position: Sitting, Cuff Size: Normal)   Pulse 80   Ht 5\' 4"  (1.626 m)   Wt 193 lb (87.5 kg)   LMP 08/03/2018   BMI 33.13 kg/m  General:  Well developed, well nourished, no acute distress Skin:  Warm and dry Pelvic:  External genitalia is normal in appearance, no lesions.  The vagina is normal in appearance. Urethra has no lesions or masses. The cervix is bulbous.  Uterus is felt to be normal size, shape, and contour.  No adnexal masses or tenderness noted.Bladder is non tender, no masses felt.Yeast has resolved. Psych:  No mood changes, alert and cooperative,seems happy Examination chaperoned by Gaylyn Rong RN.  Impression: Yeast infection, resolved    Plan: Continue using antibiotic soap. F/U prn

## 2018-09-14 ENCOUNTER — Telehealth: Payer: Self-pay | Admitting: Adult Health

## 2018-09-14 MED ORDER — SULFAMETHOXAZOLE-TRIMETHOPRIM 800-160 MG PO TABS: 1.0000 | ORAL_TABLET | Freq: Two times a day (BID) | ORAL | 0 refills | Status: DC

## 2018-09-14 NOTE — Telephone Encounter (Signed)
Will rx septra ds  

## 2018-09-14 NOTE — Telephone Encounter (Signed)
Patient is requesting a prescription for a UTI that has been going on for about 2 days.  [facility]  [phone]

## 2018-09-14 NOTE — Telephone Encounter (Signed)
Patient states she is having pain with urination and frequency. Has tried AZO but it has not helped.  Wants to be treated with antibiotic. Advised to check back with pharmacy later today if she does not hear from [place].   Please advise.

## 2019-02-21 ENCOUNTER — Other Ambulatory Visit: Payer: Self-pay | Admitting: Physician Assistant

## 2019-02-21 DIAGNOSIS — Z1231 Encounter for screening mammogram for malignant neoplasm of breast: Secondary | ICD-10-CM

## 2019-04-06 ENCOUNTER — Ambulatory Visit
Admission: RE | Admit: 2019-04-06 | Discharge: 2019-04-06 | Disposition: A | Discharge status: Home / Self Care | Payer: Medicaid Other | Source: Ambulatory Visit | Attending: Physician Assistant | Admitting: Physician Assistant

## 2019-04-06 ENCOUNTER — Other Ambulatory Visit: Payer: Self-pay

## 2019-04-06 DIAGNOSIS — Z1231 Encounter for screening mammogram for malignant neoplasm of breast: Secondary | ICD-10-CM

## 2019-04-07 ENCOUNTER — Other Ambulatory Visit: Payer: Self-pay

## 2019-04-07 DIAGNOSIS — Z20822 Contact with and (suspected) exposure to covid-19: Secondary | ICD-10-CM

## 2019-04-08 LAB — NOVEL CORONAVIRUS, NAA: SARS-CoV-2, NAA: NOT DETECTED

## 2019-06-21 ENCOUNTER — Other Ambulatory Visit: Payer: Self-pay

## 2019-06-21 ENCOUNTER — Encounter: Payer: Self-pay | Admitting: Adult Health

## 2019-06-21 ENCOUNTER — Other Ambulatory Visit (HOSPITAL_COMMUNITY)
Admission: RE | Admit: 2019-06-21 | Discharge: 2019-06-21 | Disposition: A | Discharge status: Home / Self Care | Payer: Medicaid Other | Source: Ambulatory Visit | Attending: Adult Health | Admitting: Adult Health

## 2019-06-21 ENCOUNTER — Ambulatory Visit: Payer: Medicaid Other | Admitting: Adult Health

## 2019-06-21 VITALS — BP 113/68 | HR 60 | Ht 64.0 in | Wt 187.0 lb

## 2019-06-21 DIAGNOSIS — Z113 Encounter for screening for infections with a predominantly sexual mode of transmission: Secondary | ICD-10-CM | POA: Diagnosis not present

## 2019-06-21 DIAGNOSIS — N898 Other specified noninflammatory disorders of vagina: Secondary | ICD-10-CM | POA: Diagnosis not present

## 2019-06-21 DIAGNOSIS — N760 Acute vaginitis: Secondary | ICD-10-CM

## 2019-06-21 DIAGNOSIS — B9689 Other specified bacterial agents as the cause of diseases classified elsewhere: Secondary | ICD-10-CM

## 2019-06-21 LAB — POCT WET PREP (WET MOUNT)
Clue Cells Wet Prep Whiff POC: POSITIVE
WBC, Wet Prep HPF POC: POSITIVE

## 2019-06-21 MED ORDER — METRONIDAZOLE 500 MG PO TABS: 500.0000 mg | ORAL_TABLET | Freq: Two times a day (BID) | ORAL | 0 refills | Status: DC

## 2019-06-21 NOTE — Progress Notes (Signed)
  Subjective:     Patient ID: Cynthia Molina, female   DOB: 1980/12/14, 39 y.o.   MRN: [id number]  HPI Bayli is a 39 year old black female,divorced, [id number], in complaining of vaginal irritation and light white discharge for a bout a week after taking cranberry pills.   Review of Systems +vaginal irritation and light white discharge for about a week, started after taking cranberry pill, so stopped that.  Reviewed past medical,surgical, social and family history. Reviewed medications and allergies.     Objective:   Physical Exam BP 113/68 (BP Location: Right Arm, Patient Position: Sitting, Cuff Size: Normal)   Pulse 60   Ht 5\' 4"  (1.626 m)   Wt 187 lb (84.8 kg)   LMP 05/27/2019 (Exact Date)   BMI 32.10 kg/m  Skin warm and dry.Pelvic: external genitalia is normal in appearance no lesions, vagina: white frothy discharge with slight odor,urethra has no lesions or masses noted, cervix:smooth and bulbous, uterus: normal size, shape and contour, non tender, no masses felt, adnexa: no masses or tenderness noted. Bladder is non tender and no masses felt. Wet prep: + for clue cells and +WBCs. CV swab obtained. Examination chaperoned by Estill Bamberg Rash LPN    Assessment:     1. Vaginal discharge  2. Vaginal irritation   3. Screening examination for STD (sexually transmitted disease) CV swab for GC/CHL and trich  4. BV (bacterial vaginosis) Will rx flagyl, no alcohol or sex while taking Meds ordered this encounter  Medications  . metroNIDAZOLE (FLAGYL) 500 MG tablet    Sig: Take 1 tablet (500 mg total) by mouth 2 (two) times daily.    Dispense:  14 tablet    Refill:  0    Order Specific Question:   Supervising Provider    Answer:   Florian Buff [[id number]]      Plan:     Return in 4 weeks for pap and physical

## 2019-06-22 LAB — CERVICOVAGINAL ANCILLARY ONLY
Chlamydia: NEGATIVE
Comment: NEGATIVE
Comment: NEGATIVE
Comment: NORMAL
Neisseria Gonorrhea: NEGATIVE
Trichomonas: NEGATIVE

## 2019-07-19 ENCOUNTER — Other Ambulatory Visit: Payer: Medicaid Other | Admitting: Adult Health

## 2019-07-21 ENCOUNTER — Telehealth: Payer: Self-pay | Admitting: *Deleted

## 2019-07-21 MED ORDER — FLUCONAZOLE 150 MG PO TABS: ORAL_TABLET | ORAL | 1 refills | Status: DC

## 2019-07-21 NOTE — Telephone Encounter (Signed)
Pt left message that she needs to speak with [facility] ASAP.

## 2019-07-21 NOTE — Telephone Encounter (Signed)
Pt has itch requesting diflucan will sent in now

## 2019-10-10 ENCOUNTER — Other Ambulatory Visit: Payer: Medicaid Other | Admitting: Adult Health

## 2019-10-23 ENCOUNTER — Telehealth: Payer: Self-pay | Admitting: Adult Health

## 2019-10-23 NOTE — Telephone Encounter (Signed)
Pt is requesting nurse to give her a call

## 2019-10-23 NOTE — Telephone Encounter (Signed)
Telephoned patient at home number and voicemail box not set up yet.

## 2019-10-24 ENCOUNTER — Telehealth: Payer: Self-pay | Admitting: Adult Health

## 2019-10-24 NOTE — Telephone Encounter (Signed)
Anderson Malta called patient yesterday and patient missed call; pt returned call this morning. Wanted to see if Anderson Malta could give her another call.

## 2019-10-24 NOTE — Telephone Encounter (Signed)
Telephoned patient at home number. Patient states has a pap appointment next month and wanted to know if Derrek Monaco, NP could treat for yeast then. Advised patient could treat for yeast if was seen during exam. Patient voiced understanding.

## 2019-11-14 ENCOUNTER — Ambulatory Visit: Payer: Medicaid Other | Admitting: Adult Health

## 2019-11-14 ENCOUNTER — Encounter: Payer: Self-pay | Admitting: Adult Health

## 2019-11-14 ENCOUNTER — Other Ambulatory Visit (HOSPITAL_COMMUNITY)
Admission: RE | Admit: 2019-11-14 | Discharge: 2019-11-14 | Disposition: A | Discharge status: Home / Self Care | Payer: Medicaid Other | Source: Ambulatory Visit | Attending: Adult Health | Admitting: Adult Health

## 2019-11-14 VITALS — BP 113/70 | HR 83 | Ht 64.0 in | Wt 184.6 lb

## 2019-11-14 DIAGNOSIS — Z01419 Encounter for gynecological examination (general) (routine) without abnormal findings: Secondary | ICD-10-CM | POA: Insufficient documentation

## 2019-11-14 DIAGNOSIS — Z0000 Encounter for general adult medical examination without abnormal findings: Secondary | ICD-10-CM | POA: Diagnosis not present

## 2019-11-14 NOTE — Progress Notes (Signed)
Patient ID: Cynthia Molina, female   DOB: 1981/03/13, 39 y.o.   MRN: [id number] History of Present Illness: Cynthia Molina is a 39 year old black female,divorced, [id number], in for well woman gyn exam and pap. No PCP.   Current Medications, Allergies, Past Medical History, Past Surgical History, Family History and Social History were reviewed in [employer] record.     Review of Systems:  Patient denies any headaches, hearing loss, fatigue, blurred vision, shortness of breath, chest pain, abdominal pain, problems with bowel movements, urination, or intercourse. No joint pain or mood swings.periods can be heavy at times, changes pads every 3 hours or so, has clots, lasts about 6-7 days No birth control use and she declines  Physical Exam:BP 113/70 (BP Location: Right Arm, Patient Position: Sitting, Cuff Size: Normal)   Pulse 83   Ht 5\' 4"  (1.626 m)   Wt 184 lb 9.6 oz (83.7 kg)   LMP 11/04/2019 (Exact Date)   BMI 31.69 kg/m  General:  Well developed, well nourished, no acute distress Skin:  Warm and dry Neck:  Midline trachea, normal thyroid, good ROM, no lymphadenopathy Lungs; Clear to auscultation bilaterally Breast:  No dominant palpable mass, retraction, or nipple discharge Cardiovascular: Regular rate and rhythm Abdomen:  Soft, non tender, no hepatosplenomegaly Pelvic:  External genitalia is normal in appearance, no lesions.  The vagina is normal in appearance. Urethra has no lesions or masses. The cervix is bulbous.Pap with GC/CHL and high risk HPV 16/18 genotyping performed.  Uterus is felt to be normal size, shape, and contour.  No adnexal masses or tenderness noted.Bladder is non tender, no masses felt. Extremities/musculoskeletal:  No swelling or varicosities noted, no clubbing or cyanosis Psych:  No mood changes, alert and cooperative,seems happy AA 0 Fall risk is low PHQ 9 score is 11, no SI or HI, declines meds, may go back to therapy she says. Examination  chaperoned by Cynthia Molina CMA.  Impression and plan: 1. Encounter for gynecological examination with Papanicolaou smear of cervix Pap sent Physical in 1 year Pap in 3 if normal  Mammogram in November  Check CBC,CMP,TSH and lipids

## 2019-11-15 ENCOUNTER — Encounter: Payer: Self-pay | Admitting: Adult Health

## 2019-11-15 ENCOUNTER — Telehealth: Payer: Self-pay | Admitting: Adult Health

## 2019-11-15 DIAGNOSIS — D649 Anemia, unspecified: Secondary | ICD-10-CM

## 2019-11-15 HISTORY — DX: Anemia, unspecified: D64.9

## 2019-11-15 LAB — COMPREHENSIVE METABOLIC PANEL
ALT: 9 IU/L (ref 0–32)
AST: 12 IU/L (ref 0–40)
Albumin/Globulin Ratio: 1.5 (ref 1.2–2.2)
Albumin: 4.3 g/dL (ref 3.8–4.8)
Alkaline Phosphatase: 69 IU/L (ref 48–121)
BUN/Creatinine Ratio: 11 (ref 9–23)
BUN: 9 mg/dL (ref 6–20)
Bilirubin Total: 0.4 mg/dL (ref 0.0–1.2)
CO2: 22 mmol/L (ref 20–29)
Calcium: 9.2 mg/dL (ref 8.7–10.2)
Chloride: 103 mmol/L (ref 96–106)
Creatinine, Ser: 0.81 mg/dL (ref 0.57–1.00)
GFR calc Af Amer: 106 mL/min/{1.73_m2} (ref >59)
GFR calc non Af Amer: 92 mL/min/{1.73_m2} (ref >59)
Globulin, Total: 2.8 g/dL (ref 1.5–4.5)
Glucose: 99 mg/dL (ref 65–99)
Potassium: 4.4 mmol/L (ref 3.5–5.2)
Sodium: 138 mmol/L (ref 134–144)
Total Protein: 7.1 g/dL (ref 6.0–8.5)

## 2019-11-15 LAB — CBC
Hematocrit: 30.8 % — ABNORMAL LOW (ref 34.0–46.6)
Hemoglobin: 9.5 g/dL — ABNORMAL LOW (ref 11.1–15.9)
MCH: 23.4 pg — ABNORMAL LOW (ref 26.6–33.0)
MCHC: 30.8 g/dL — ABNORMAL LOW (ref 31.5–35.7)
MCV: 76 fL — ABNORMAL LOW (ref 79–97)
Platelets: 441 10*3/uL (ref 150–450)
RBC: 4.06 x10E6/uL (ref 3.77–5.28)
RDW: 14.2 % (ref 11.7–15.4)
WBC: 7.8 10*3/uL (ref 3.4–10.8)

## 2019-11-15 LAB — LIPID PANEL
Chol/HDL Ratio: 2.9 ratio (ref 0.0–4.4)
Cholesterol, Total: 149 mg/dL (ref 100–199)
HDL: 52 mg/dL (ref >39)
LDL Chol Calc (NIH): 85 mg/dL (ref 0–99)
Triglycerides: 58 mg/dL (ref 0–149)
VLDL Cholesterol Cal: 12 mg/dL (ref 5–40)

## 2019-11-15 LAB — TSH: TSH: 2.3 u[IU]/mL (ref 0.450–4.500)

## 2019-11-15 NOTE — Telephone Encounter (Signed)
Phone busy.

## 2019-11-20 LAB — CYTOLOGY - PAP
Adequacy: ABSENT
Chlamydia: NEGATIVE
Comment: NEGATIVE
Comment: NEGATIVE
Comment: NEGATIVE
Comment: NORMAL
HPV 16: NEGATIVE
HPV 18 / 45: NEGATIVE
High risk HPV: POSITIVE — AB
Neisseria Gonorrhea: NEGATIVE

## 2019-11-21 ENCOUNTER — Telehealth: Payer: Self-pay | Admitting: Adult Health

## 2019-11-21 ENCOUNTER — Encounter: Payer: Self-pay | Admitting: Adult Health

## 2019-11-21 DIAGNOSIS — R87618 Other abnormal cytological findings on specimens from cervix uteri: Secondary | ICD-10-CM

## 2019-11-21 HISTORY — DX: Other abnormal cytological findings on specimens from cervix uteri: R87.618

## 2019-11-21 MED ORDER — METRONIDAZOLE 500 MG PO TABS: 500.0000 mg | ORAL_TABLET | Freq: Two times a day (BID) | ORAL | 0 refills | Status: DC

## 2019-11-21 NOTE — Telephone Encounter (Signed)
Pt aware that Pap LSIL, +HPV negative GC/CHL but +BV will rx flagyl and get colpo appt, has had several +HPVs in the past and ASCCP says use judgement and since has abnormal cells now will get colpo

## 2019-12-11 ENCOUNTER — Encounter: Payer: Medicaid Other | Admitting: Obstetrics & Gynecology

## 2019-12-26 ENCOUNTER — Encounter: Payer: Medicaid Other | Admitting: Obstetrics & Gynecology

## 2020-01-02 ENCOUNTER — Encounter: Payer: Self-pay | Admitting: Obstetrics & Gynecology

## 2020-01-02 ENCOUNTER — Ambulatory Visit (INDEPENDENT_AMBULATORY_CARE_PROVIDER_SITE_OTHER): Payer: Medicaid Other | Admitting: Obstetrics & Gynecology

## 2020-01-02 VITALS — BP 115/71 | HR 77 | Ht 64.5 in | Wt 183.0 lb

## 2020-01-02 DIAGNOSIS — Z3202 Encounter for pregnancy test, result negative: Secondary | ICD-10-CM | POA: Diagnosis not present

## 2020-01-02 DIAGNOSIS — N870 Mild cervical dysplasia: Secondary | ICD-10-CM

## 2020-01-02 LAB — POCT URINE PREGNANCY: Preg Test, Ur: NEGATIVE

## 2020-01-02 MED ORDER — DESOGESTREL-ETHINYL ESTRADIOL 0.15-30 MG-MCG PO TABS: 1.0000 | ORAL_TABLET | Freq: Every day | ORAL | 12 refills | Status: DC

## 2020-01-02 NOTE — Progress Notes (Signed)
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 New sexual partner:  No.  : time frame:  No.  History of abnormal Pap: yes  Procedure Details  The risks and benefits of the procedure and Written informed consent obtained.  Speculum placed in vagina and excellent visualization of cervix achieved, cervix swabbed x 3 with acetic acid solution.  Findings: Cervix: no visible lesions, no mosaicism, no punctation and no abnormal vasculature; SCJ visualized 360 degrees without lesions and no biopsies taken. Vaginal inspection: normal without visible lesions. Vulvar colposcopy: vulvar colposcopy not performed.  Specimens: none  Complications: none.  Plan(Based on 2019 ASCCP recommendations) Repeat HPV based cytology 1 year

## 2020-01-26 ENCOUNTER — Telehealth: Payer: Self-pay | Admitting: Adult Health

## 2020-01-26 MED ORDER — METRONIDAZOLE 500 MG PO TABS: 500.0000 mg | ORAL_TABLET | Freq: Two times a day (BID) | ORAL | 0 refills | Status: DC

## 2020-01-26 NOTE — Telephone Encounter (Signed)
Telephoned patient at home number and patient states has history of BV. Patient is having discharge with odor and would like prescription called into pharmacy. Advised patient would send to provider if not heard back from [place] to check with pharmacy. Patient voiced understanding.

## 2020-01-26 NOTE — Telephone Encounter (Signed)
Patient would like a call back but did not state the reason for the call.

## 2020-01-26 NOTE — Telephone Encounter (Signed)
Will rx flagyl  

## 2020-01-26 NOTE — Telephone Encounter (Signed)
Telephoned patient at home number and unable to leave message due to mailbox not set up.

## 2020-01-26 NOTE — Addendum Note (Signed)
Addended by: Derrek Monaco A on: 01/26/2020 02:07 PM   Modules accepted: Orders

## 2020-02-02 ENCOUNTER — Telehealth: Payer: Self-pay | Admitting: Adult Health

## 2020-02-02 NOTE — Telephone Encounter (Signed)
Voice mail not set up @ 11:03 am. [employer]

## 2020-02-02 NOTE — Telephone Encounter (Signed)
Pt is on med for BV. Started med 4 days ago. Pt feels like she has a yeast infection. She has Diflucan at home. Will take 1 today and 1 in 3 days if need be. Pt wanted to schedule an appt with Jenn, just in case. Will cancel this appt if Diflucan clears it up. Call transferred to [facility] for appt. [place]

## 2020-02-02 NOTE — Telephone Encounter (Signed)
Patient is requesting to speak with JG personally regarding symptoms of 'BV' and yeast infection

## 2020-02-09 ENCOUNTER — Encounter: Payer: Self-pay | Admitting: Adult Health

## 2020-02-09 ENCOUNTER — Other Ambulatory Visit (HOSPITAL_COMMUNITY)
Admission: RE | Admit: 2020-02-09 | Discharge: 2020-02-09 | Disposition: A | Discharge status: Home / Self Care | Payer: Medicaid Other | Source: Ambulatory Visit | Attending: Adult Health | Admitting: Adult Health

## 2020-02-09 ENCOUNTER — Ambulatory Visit (INDEPENDENT_AMBULATORY_CARE_PROVIDER_SITE_OTHER): Payer: Medicaid Other | Admitting: Adult Health

## 2020-02-09 ENCOUNTER — Other Ambulatory Visit: Payer: Self-pay

## 2020-02-09 VITALS — BP 108/71 | HR 70 | Ht 65.0 in | Wt 186.0 lb

## 2020-02-09 DIAGNOSIS — N898 Other specified noninflammatory disorders of vagina: Secondary | ICD-10-CM

## 2020-02-09 DIAGNOSIS — B379 Candidiasis, unspecified: Secondary | ICD-10-CM | POA: Insufficient documentation

## 2020-02-09 LAB — POCT WET PREP (WET MOUNT)

## 2020-02-09 MED ORDER — FLUCONAZOLE 150 MG PO TABS: ORAL_TABLET | ORAL | 1 refills | Status: DC

## 2020-02-09 NOTE — Progress Notes (Signed)
°  Subjective:     Patient ID: Cynthia Molina, female   DOB: June 20, 1980, 39 y.o.   MRN: [id number]  HPI Cynthia Molina is a 39 year old black female,divorced, [id number], in complaining of vaginal itching. It is better since taking diflucan she found.  Review of Systems +vaginal itching,better since taking diflucan  Had white clumpy vaginal discharge  Reviewed past medical,surgical, social and family history. Reviewed medications and allergies.     Objective:   Physical Exam BP 108/71 (BP Location: Right Arm, Patient Position: Sitting, Cuff Size: Normal)    Pulse 70    Ht 5\' 5"  (1.651 m)    Wt 186 lb (84.4 kg)    LMP 01/10/2020    BMI 30.95 kg/m  Skin warm and dry.Pelvic: external genitalia is normal in appearance no lesions, vagina: white discharge without odor,urethra has no lesions or masses noted, cervix:smooth and bulbous, uterus: normal size, shape and contour, non tender, no masses felt, adnexa: no masses or tenderness noted. Bladder is non tender and no masses felt. Wet prep: + for yeast CV swab obtained.   [facility] - 02/09/20 1024      Pregnancy Intention Screening   Does the patient want to become pregnant in the next year? Ok Either Way    Does the patient's partner want to become pregnant in the next year? Yes    Would the patient like to discuss contraceptive options today? No      Contraception Wrap Up   Current Method Oral Contraceptive    End Method Oral Contraceptive    Contraception Counseling Provided No         Examination chaperoned by Alice Rieger RN     Assessment:     1. Vaginal itching CV swab sent   2. Yeast infection Will rx diflucan Meds ordered this encounter  Medications   fluconazole (DIFLUCAN) 150 MG tablet    Sig: Take 1 now and 1 in 3 days if needed    Dispense:  2 tablet    Refill:  1    Order Specific Question:   Supervising Provider    Answer:   Florian Buff [[id number]]       Plan:     Follow up prn

## 2020-02-13 LAB — CERVICOVAGINAL ANCILLARY ONLY
Bacterial Vaginitis (gardnerella): NEGATIVE
Candida Glabrata: NEGATIVE
Candida Vaginitis: NEGATIVE
Chlamydia: NEGATIVE
Comment: NEGATIVE
Comment: NEGATIVE
Comment: NEGATIVE
Comment: NEGATIVE
Comment: NEGATIVE
Comment: NORMAL
Neisseria Gonorrhea: NEGATIVE
Trichomonas: NEGATIVE

## 2020-02-20 DIAGNOSIS — Z114 Encounter for screening for human immunodeficiency virus [HIV]: Secondary | ICD-10-CM | POA: Diagnosis not present

## 2020-02-20 DIAGNOSIS — Z131 Encounter for screening for diabetes mellitus: Secondary | ICD-10-CM | POA: Diagnosis not present

## 2020-02-20 DIAGNOSIS — Z0000 Encounter for general adult medical examination without abnormal findings: Secondary | ICD-10-CM | POA: Diagnosis not present

## 2020-02-20 DIAGNOSIS — R5383 Other fatigue: Secondary | ICD-10-CM | POA: Diagnosis not present

## 2020-02-20 DIAGNOSIS — E559 Vitamin D deficiency, unspecified: Secondary | ICD-10-CM | POA: Diagnosis not present

## 2020-02-20 DIAGNOSIS — Z13220 Encounter for screening for lipoid disorders: Secondary | ICD-10-CM | POA: Diagnosis not present

## 2020-02-20 DIAGNOSIS — Z7251 High risk heterosexual behavior: Secondary | ICD-10-CM | POA: Diagnosis not present

## 2020-02-22 ENCOUNTER — Other Ambulatory Visit: Payer: Self-pay | Admitting: Physician Assistant

## 2020-02-22 ENCOUNTER — Other Ambulatory Visit: Payer: Self-pay | Admitting: Internal Medicine

## 2020-02-22 ENCOUNTER — Other Ambulatory Visit: Payer: Self-pay | Admitting: Family Medicine

## 2020-02-22 DIAGNOSIS — Z1231 Encounter for screening mammogram for malignant neoplasm of breast: Secondary | ICD-10-CM

## 2020-04-03 ENCOUNTER — Ambulatory Visit: Payer: Medicaid Other | Admitting: Student

## 2020-04-08 ENCOUNTER — Ambulatory Visit: Payer: Medicaid Other

## 2020-04-18 ENCOUNTER — Telehealth: Payer: Self-pay | Admitting: *Deleted

## 2020-04-18 MED ORDER — FLUCONAZOLE 150 MG PO TABS: ORAL_TABLET | ORAL | 1 refills | Status: DC

## 2020-04-18 MED ORDER — METRONIDAZOLE 500 MG PO TABS: 500.0000 mg | ORAL_TABLET | Freq: Two times a day (BID) | ORAL | 0 refills | Status: DC

## 2020-04-18 NOTE — Telephone Encounter (Signed)
Patient informed Anderson Malta sent in Diflucan and Flagyl but if symptoms do not get better after taking the medication, will need to make appointment for self swab. Pt verbalized understanding and agreeable to do so.

## 2020-04-18 NOTE — Telephone Encounter (Signed)
Will refill flagyl and diflucan if does not resolve, needs self swab or appt

## 2020-04-18 NOTE — Addendum Note (Signed)
Addended by: Derrek Monaco A on: 04/18/2020 03:52 PM   Modules accepted: Orders

## 2020-04-18 NOTE — Telephone Encounter (Signed)
Patient states she has just finished her cycle and is having itching and discharge,no odor.  She is requesting to see a provider to have this examined but no appointments are available this week or next.  Does not want to do a self swab.  Was recently treated with Flagyl and Diflucan in August and September.  Please advise.

## 2020-05-15 ENCOUNTER — Ambulatory Visit: Payer: Medicaid Other

## 2020-06-10 ENCOUNTER — Ambulatory Visit: Payer: Medicaid Other

## 2020-07-19 ENCOUNTER — Ambulatory Visit: Payer: Medicaid Other

## 2020-08-22 ENCOUNTER — Telehealth: Payer: Self-pay

## 2020-08-22 NOTE — Telephone Encounter (Signed)
Pt calling has questions about a medication called Prep. And is wanting to know if someone could speak with her about this medication.

## 2020-08-28 ENCOUNTER — Ambulatory Visit: Payer: Medicaid Other

## 2020-11-14 ENCOUNTER — Other Ambulatory Visit: Payer: Medicaid Other | Admitting: Adult Health

## 2020-12-13 ENCOUNTER — Ambulatory Visit: Payer: Medicaid Other

## 2020-12-15 ENCOUNTER — Other Ambulatory Visit: Payer: Self-pay

## 2020-12-15 ENCOUNTER — Ambulatory Visit (HOSPITAL_COMMUNITY)
Admission: EM | Admit: 2020-12-15 | Discharge: 2020-12-15 | Disposition: A | Discharge status: Home / Self Care | Payer: Medicaid Other | Attending: Internal Medicine | Admitting: Internal Medicine

## 2020-12-15 DIAGNOSIS — H9202 Otalgia, left ear: Secondary | ICD-10-CM

## 2020-12-15 DIAGNOSIS — H60502 Unspecified acute noninfective otitis externa, left ear: Secondary | ICD-10-CM

## 2020-12-15 MED ORDER — OFLOXACIN 0.3 % OT SOLN: 10.0000 [drp] | Freq: Every day | OTIC | 0 refills | Status: AC

## 2020-12-15 MED ORDER — AMOXICILLIN 875 MG PO TABS: 875.0000 mg | ORAL_TABLET | Freq: Two times a day (BID) | ORAL | 0 refills | Status: AC

## 2020-12-15 NOTE — ED Triage Notes (Signed)
PT reports fullness in Lt ear.

## 2020-12-15 NOTE — ED Provider Notes (Signed)
[facility]    CSN: [id number] Arrival date & time: 12/15/20  1100      History   Chief Complaint Chief Complaint  Patient presents with   Ear Fullness    LT    HPI Cynthia Molina is a 40 y.o. female.   Patient presents to the urgent care for left ear discomfort and pain. Patient states that she tried to clean her ear out with a bobby pin a few days prior when ear pain and discomfort started. Denies any decreased hearing. Patient has felt like her ear is swollen and has noticed some drainage coming from left ear. Denies any known fevers at home. Denies any other upper respiratory symptoms.    Ear Fullness   Past Medical History:  Diagnosis Date   Abnormal Papanicolaou smear of cervix with positive human papilloma virus (HPV) test 11/21/2019   LSIL will get colpo____________   Anemia 11/15/2019   Anxiety    Bacterial vaginosis    Depression    Fibroid 09/29/2017   Heart murmur    HPV (human papilloma virus) infection    Hx of trichomoniasis    Miscarriage    Thickened endometrium 09/29/2017   Will get bx    Patient Active Problem List   Diagnosis Date Noted   Yeast infection 02/09/2020   Vaginal itching 02/09/2020   Abnormal Papanicolaou smear of cervix with positive human papilloma virus (HPV) test 11/21/2019   Anemia 11/15/2019   Screening examination for STD (sexually transmitted disease) 06/21/2019   Vaginal irritation 06/21/2019   Vaginal discharge 05/27/2018   Fibroid 09/29/2017   BV (bacterial vaginosis) 12/14/2016   HPV test positive 12/20/2012    Past Surgical History:  Procedure Laterality Date   CESAREAN SECTION     elective abortion      OB History     Gravida  6   Para  1   Term      Preterm  1   AB  5   Living  1      SAB  3   IAB      Ectopic      Multiple      Live Births  1            Home Medications    Prior to Admission medications   Medication Sig Start Date End Date Taking? Authorizing  Provider  amoxicillin (AMOXIL) 875 MG tablet Take 1 tablet (875 mg total) by mouth 2 (two) times daily for 10 days. 12/15/20 12/25/20 Yes Odis Luster, FNP  ofloxacin (FLOXIN) 0.3 % OTIC solution Place 10 drops into the left ear daily for 7 days. 12/15/20 12/22/20 Yes Odis Luster, FNP  desogestrel-ethinyl estradiol (APRI) 0.15-30 MG-MCG tablet Take 1 tablet by mouth daily. 01/02/20   Florian Buff, MD  fluconazole (DIFLUCAN) 150 MG tablet Take 1 now and 1 in 3 days if needed 04/18/20   Estill Dooms, NP  metroNIDAZOLE (FLAGYL) 500 MG tablet Take 1 tablet (500 mg total) by mouth 2 (two) times daily. 04/18/20   Estill Dooms, NP  Multiple Vitamins-Minerals (HAIR SKIN AND NAILS FORMULA) TABS Take by mouth. Patient not taking: Reported on 02/09/2020    [provider]  OVER THE COUNTER MEDICATION Apple cider vitamin gummie    [provider]    Family History Family History  Problem Relation Age of Onset   Other Father        collapsed lung; breathing issues  Asthma Daughter    Anxiety disorder Daughter    Depression Daughter     Social History Social History   Tobacco Use   Smoking status: Never   Smokeless tobacco: Never  Vaping Use   Vaping Use: Never used  Substance Use Topics   Alcohol use: No   Drug use: No     Allergies   Compazine [prochlorperazine edisylate]   Review of Systems Review of Systems Per HPI  Physical Exam Triage Vital Signs ED Triage Vitals  Enc Vitals Group     BP 12/15/20 1150 111/71     Pulse Rate 12/15/20 1150 66     Resp 12/15/20 1150 18     Temp 12/15/20 1150 98.6 F (37 C)     Temp src --      SpO2 12/15/20 1150 100 %     Weight --      Height --      Head Circumference --      Peak Flow --      Pain Score 12/15/20 1151 0     Pain Loc --      Pain Edu? --      Excl. in [place]? --    No data found.  Updated Vital Signs BP 111/71   Pulse 66   Temp 98.6 F (37 C)   Resp 18   LMP 11/18/2020  (Approximate)   SpO2 100%   Visual Acuity Right Eye Distance:   Left Eye Distance:   Bilateral Distance:    Right Eye Near:   Left Eye Near:    Bilateral Near:     Physical Exam Constitutional:      General: She is not in acute distress.    Appearance: Normal appearance.  HENT:     Head: Normocephalic and atraumatic.     Right Ear: Hearing, tympanic membrane, ear canal and external ear normal.     Left Ear: Hearing, tympanic membrane and external ear normal. No decreased hearing noted. Swelling and tenderness present. No drainage.  No middle ear effusion. There is no impacted cerumen. No foreign body. Tympanic membrane is not perforated, erythematous or bulging.     Nose: Nose normal.     Mouth/Throat:     Pharynx: Oropharynx is clear. No posterior oropharyngeal erythema.  Eyes:     Extraocular Movements: Extraocular movements intact.     Conjunctiva/sclera: Conjunctivae normal.  Pulmonary:     Effort: Pulmonary effort is normal.  Neurological:     General: No focal deficit present.     Mental Status: She is alert and oriented to person, place, and time. Mental status is at baseline.  Psychiatric:        Mood and Affect: Mood normal.        Behavior: Behavior normal.        Thought Content: Thought content normal.        Judgment: Judgment normal.     UC Treatments / Results  Labs (all labs ordered are listed, but only abnormal results are displayed) Labs Reviewed - No data to display  EKG   Radiology No results found.  Procedures Procedures (including critical care time)  Medications Ordered in UC Medications - No data to display  Initial Impression / Assessment and Plan / UC Course  I have reviewed the triage vital signs and the nursing notes.  Pertinent labs & imaging results that were available during my care of the patient were reviewed by me and considered in my  medical decision making (see chart for details).     Suspect that patient caused  irritation with insertion of bobby pin into left ear that led to infection to left external canal. Will treat with ofloxacin antibiotic ear drops and amoxicillin x 10 days. Patient advised to follow up if symptoms do not improve or if they worsen. Patient to monitor for fever.  Discussed strict return precautions. Patient verbalized understanding and is agreeable with plan.  Final Clinical Impressions(s) / UC Diagnoses   Final diagnoses:  Acute otitis externa of left ear, unspecified type  Left ear pain     Discharge Instructions      You are being treated for left ear infection with amoxicillin oral antibiotic and ofloxacin antibiotic ear drops. Please take these as prescribed. Please follow up if symptoms continue or if symptoms worsen. Please monitor for fever.      ED Prescriptions     Medication Sig Dispense Auth. Provider   amoxicillin (AMOXIL) 875 MG tablet Take 1 tablet (875 mg total) by mouth 2 (two) times daily for 10 days. 20 tablet Odis Luster, FNP   ofloxacin (FLOXIN) 0.3 % OTIC solution Place 10 drops into the left ear daily for 7 days. 3.5 mL Odis Luster, FNP      PDMP not reviewed this encounter.   Odis Luster, FNP 12/15/20 1234

## 2020-12-15 NOTE — Discharge Instructions (Addendum)
You are being treated for left ear infection with amoxicillin oral antibiotic and ofloxacin antibiotic ear drops. Please take these as prescribed. Please follow up if symptoms continue or if symptoms worsen. Please monitor for fever.

## 2020-12-27 ENCOUNTER — Other Ambulatory Visit: Payer: Medicaid Other

## 2022-01-14 DIAGNOSIS — A5901 Trichomonal vulvovaginitis: Secondary | ICD-10-CM | POA: Diagnosis not present

## 2022-01-14 DIAGNOSIS — N898 Other specified noninflammatory disorders of vagina: Secondary | ICD-10-CM | POA: Diagnosis not present

## 2022-05-21 ENCOUNTER — Other Ambulatory Visit: Payer: Medicaid Other

## 2022-05-21 ENCOUNTER — Telehealth: Payer: Medicaid Other | Admitting: Physician Assistant

## 2022-05-21 DIAGNOSIS — B3731 Acute candidiasis of vulva and vagina: Secondary | ICD-10-CM | POA: Diagnosis not present

## 2022-05-21 MED ORDER — FLUCONAZOLE 150 MG PO TABS: 150.0000 mg | ORAL_TABLET | Freq: Once | ORAL | 0 refills | Status: AC

## 2022-05-21 NOTE — Progress Notes (Signed)
I have spent 5 minutes in review of e-visit questionnaire, review and updating patient chart, medical decision making and response to patient.   Rudine Rieger Cody Alaiza Yau, PA-C    

## 2022-05-21 NOTE — Progress Notes (Signed)

## 2022-07-16 ENCOUNTER — Telehealth: Payer: Medicaid Other | Admitting: Family Medicine

## 2022-07-16 DIAGNOSIS — B3731 Acute candidiasis of vulva and vagina: Secondary | ICD-10-CM

## 2022-07-16 MED ORDER — FLUCONAZOLE 150 MG PO TABS: 150.0000 mg | ORAL_TABLET | Freq: Every day | ORAL | 0 refills | Status: DC

## 2022-07-16 NOTE — Progress Notes (Signed)

## 2022-07-18 ENCOUNTER — Other Ambulatory Visit: Payer: Self-pay | Admitting: Family Medicine

## 2022-07-18 DIAGNOSIS — B3731 Acute candidiasis of vulva and vagina: Secondary | ICD-10-CM

## 2022-07-21 ENCOUNTER — Other Ambulatory Visit (INDEPENDENT_AMBULATORY_CARE_PROVIDER_SITE_OTHER): Payer: Medicaid Other

## 2022-07-21 ENCOUNTER — Other Ambulatory Visit (HOSPITAL_COMMUNITY)
Admission: RE | Admit: 2022-07-21 | Discharge: 2022-07-21 | Disposition: A | Discharge status: Home / Self Care | Payer: Medicaid Other | Source: Ambulatory Visit | Attending: Obstetrics & Gynecology | Admitting: Obstetrics & Gynecology

## 2022-07-21 DIAGNOSIS — N898 Other specified noninflammatory disorders of vagina: Secondary | ICD-10-CM | POA: Insufficient documentation

## 2022-07-21 NOTE — Progress Notes (Signed)
   NURSE VISIT- VAGINITIS/STD/POC  SUBJECTIVE:  Cynthia Molina is a 42 y.o. [id number] GYN patientfemale here for a vaginal swab for vaginitis screening, STD screen.  She reports the following symptoms: discharge described as normal and physiologic for several days . Just feels like her ph is off since her cycle.  Denies abnormal vaginal bleeding, significant pelvic pain, fever, or UTI symptoms.  OBJECTIVE:  There were no vitals taken for this visit.  Appears well, in no apparent distress  ASSESSMENT: Vaginal swab for vaginitis screening  PLAN: Self-collected vaginal probe for Gonorrhea, Chlamydia, Trichomonas, Bacterial Vaginosis, Yeast sent to lab Treatment: to be determined once results are received Follow-up as needed if symptoms persist/worsen, or new symptoms develop  Janece Canterbury  07/21/2022 3:46 PM

## 2022-07-24 ENCOUNTER — Other Ambulatory Visit: Payer: Self-pay | Admitting: Adult Health

## 2022-07-24 DIAGNOSIS — B3731 Acute candidiasis of vulva and vagina: Secondary | ICD-10-CM

## 2022-07-24 LAB — CERVICOVAGINAL ANCILLARY ONLY
Bacterial Vaginitis (gardnerella): NEGATIVE
Candida Glabrata: NEGATIVE
Candida Vaginitis: POSITIVE — AB
Chlamydia: NEGATIVE
Comment: NEGATIVE
Comment: NEGATIVE
Comment: NEGATIVE
Comment: NEGATIVE
Comment: NEGATIVE
Comment: NORMAL
Neisseria Gonorrhea: NEGATIVE
Trichomonas: NEGATIVE

## 2022-07-24 MED ORDER — FLUCONAZOLE 150 MG PO TABS: ORAL_TABLET | ORAL | 0 refills | Status: DC

## 2022-07-24 NOTE — Progress Notes (Signed)
+  yeast on vaginal swab rx diflucan

## 2022-08-02 DIAGNOSIS — N390 Urinary tract infection, site not specified: Secondary | ICD-10-CM | POA: Diagnosis not present

## 2022-08-02 DIAGNOSIS — Z6841 Body Mass Index (BMI) 40.0 and over, adult: Secondary | ICD-10-CM | POA: Diagnosis not present

## 2022-08-02 DIAGNOSIS — N760 Acute vaginitis: Secondary | ICD-10-CM | POA: Diagnosis not present

## 2022-08-16 ENCOUNTER — Telehealth: Payer: Medicaid Other | Admitting: Physician Assistant

## 2022-08-16 DIAGNOSIS — B9689 Other specified bacterial agents as the cause of diseases classified elsewhere: Secondary | ICD-10-CM

## 2022-08-16 DIAGNOSIS — B379 Candidiasis, unspecified: Secondary | ICD-10-CM

## 2022-08-17 NOTE — Progress Notes (Signed)
Because you have not had official testing with recurrent symptoms over the last few months (since December), I feel your condition warrants further evaluation and I recommend that you be seen in a face to face visit.   NOTE: There will be NO CHARGE for this eVisit   If you are having a true medical emergency please call 911.      For an urgent face to face visit, [place] has eight urgent care centers for your convenience:   NEW!! [facility] [place] [facility] [place] Get Driving Directions [id number] [address] [place], [facility] [place] [facility] [place] Get Driving Directions [id number] [address] [place], [place] [address]   [facility]) Get Driving Directions [id number] [address], [place] [address]  [facility] [place] ([place]) Get Driving Directions [id number] [address] [place],  [place]  [address]  [facility] - at [address] Get Driving Directions  [phone] [address] [place],  [place] [address]   [facility] Get Driving Directions [id number] [address], [place], [place] [address]   [facility] Get Driving Directions  [id number] [address] [place], [place] [address]   [facility] [place] Get Driving Directions [id number] [address] [place],  [address]  Your MyChart E-visit questionnaire answers were reviewed by a board certified advanced clinical practitioner to complete your personal care plan based on your specific symptoms.  Thank you for using e-Visits.   I have spent 5 minutes in review of e-visit questionnaire, review and updating patient chart, medical decision making and response to  patient.   Mar Daring, PA-C

## 2022-08-19 ENCOUNTER — Other Ambulatory Visit (HOSPITAL_COMMUNITY)
Admission: RE | Admit: 2022-08-19 | Discharge: 2022-08-19 | Disposition: A | Discharge status: Home / Self Care | Payer: Medicaid Other | Source: Ambulatory Visit | Attending: Obstetrics and Gynecology | Admitting: Obstetrics and Gynecology

## 2022-08-19 ENCOUNTER — Ambulatory Visit: Payer: Medicaid Other | Admitting: Obstetrics and Gynecology

## 2022-08-19 ENCOUNTER — Encounter: Payer: Self-pay | Admitting: Obstetrics and Gynecology

## 2022-08-19 VITALS — BP 133/71 | HR 90 | Ht 65.0 in | Wt 179.0 lb

## 2022-08-19 DIAGNOSIS — N898 Other specified noninflammatory disorders of vagina: Secondary | ICD-10-CM | POA: Insufficient documentation

## 2022-08-19 DIAGNOSIS — R87618 Other abnormal cytological findings on specimens from cervix uteri: Secondary | ICD-10-CM | POA: Diagnosis not present

## 2022-08-19 MED ORDER — DOXYCYCLINE HYCLATE 100 MG PO CAPS: 100.0000 mg | ORAL_CAPSULE | Freq: Two times a day (BID) | ORAL | 0 refills | Status: DC

## 2022-08-19 MED ORDER — METRONIDAZOLE 500 MG PO TABS: 500.0000 mg | ORAL_TABLET | Freq: Two times a day (BID) | ORAL | 0 refills | Status: DC

## 2022-08-19 NOTE — Progress Notes (Signed)
Ms Ayoub presents with C/O vaginal discharge and ext itching for the last few weeks Has been treated for yeast and BV but still has Sx Sexual active, last intercourse 2 days ago.  Also needs pap smear  PE AF VSS Chaperone present  Lungs clear Heart RRR Abd soft + BS GU irritated, ext genital, thick yellow vaginal discharge, vaginal swab and pap smear obtained  A/P H/O abnormal pap smear        Vaginal discharge Will Tx with Flagyl and Doxycycline x 7 days Additional Tx as pre test results F/U PRN

## 2022-08-20 LAB — CERVICOVAGINAL ANCILLARY ONLY
Bacterial Vaginitis (gardnerella): NEGATIVE
Candida Glabrata: NEGATIVE
Candida Vaginitis: POSITIVE — AB
Comment: NEGATIVE
Comment: NEGATIVE
Comment: NEGATIVE
Comment: NEGATIVE
Trichomonas: NEGATIVE

## 2022-08-21 ENCOUNTER — Other Ambulatory Visit: Payer: Self-pay | Admitting: *Deleted

## 2022-08-21 ENCOUNTER — Encounter: Payer: Self-pay | Admitting: *Deleted

## 2022-08-21 ENCOUNTER — Encounter: Payer: Self-pay | Admitting: Obstetrics and Gynecology

## 2022-08-21 MED ORDER — FLUCONAZOLE 150 MG PO TABS: ORAL_TABLET | ORAL | 1 refills | Status: DC

## 2022-08-24 LAB — CYTOLOGY - PAP
Adequacy: ABSENT
Comment: NEGATIVE
Diagnosis: NEGATIVE
High risk HPV: NEGATIVE

## 2022-08-31 ENCOUNTER — Telehealth: Payer: Medicaid Other | Admitting: Physician Assistant

## 2022-08-31 DIAGNOSIS — B379 Candidiasis, unspecified: Secondary | ICD-10-CM

## 2022-08-31 DIAGNOSIS — T3695XA Adverse effect of unspecified systemic antibiotic, initial encounter: Secondary | ICD-10-CM

## 2022-08-31 MED ORDER — FLUCONAZOLE 150 MG PO TABS: 150.0000 mg | ORAL_TABLET | ORAL | 0 refills | Status: DC | PRN

## 2022-08-31 NOTE — Progress Notes (Signed)

## 2022-09-01 ENCOUNTER — Telehealth: Payer: Self-pay | Admitting: Adult Health

## 2022-09-01 NOTE — Telephone Encounter (Signed)
Patient states she has been dealing with a yeast infection for a month and has taken diflucan. She wants to be treated with the purple stuff. Please advise.

## 2022-09-01 NOTE — Telephone Encounter (Signed)
Pt has been dealing with a yeast infection X 1 month. Has tried Diflucan. Wants to be painted purple. Call transferred to [facility] for appt. [place]

## 2022-09-06 ENCOUNTER — Telehealth: Payer: Medicaid Other | Admitting: Family

## 2022-09-06 DIAGNOSIS — B3731 Acute candidiasis of vulva and vagina: Secondary | ICD-10-CM

## 2022-09-06 MED ORDER — FLUCONAZOLE 150 MG PO TABS: 150.0000 mg | ORAL_TABLET | ORAL | 0 refills | Status: DC | PRN

## 2022-09-06 NOTE — Progress Notes (Signed)

## 2022-09-07 ENCOUNTER — Other Ambulatory Visit (HOSPITAL_COMMUNITY)
Admission: RE | Admit: 2022-09-07 | Discharge: 2022-09-07 | Disposition: A | Discharge status: Home / Self Care | Payer: Medicaid Other | Source: Ambulatory Visit | Attending: Adult Health | Admitting: Adult Health

## 2022-09-07 ENCOUNTER — Ambulatory Visit: Payer: Medicaid Other | Admitting: Adult Health

## 2022-09-07 ENCOUNTER — Encounter: Payer: Self-pay | Admitting: Adult Health

## 2022-09-07 VITALS — BP 115/73 | HR 67 | Ht 65.0 in | Wt 182.5 lb

## 2022-09-07 DIAGNOSIS — N898 Other specified noninflammatory disorders of vagina: Secondary | ICD-10-CM

## 2022-09-07 DIAGNOSIS — B3731 Acute candidiasis of vulva and vagina: Secondary | ICD-10-CM

## 2022-09-07 NOTE — Progress Notes (Addendum)
  Subjective:     Patient ID: Cynthia Molina, female   DOB: Jun 16, 1980, 42 y.o.   MRN: [id number]  HPI Cynthia Molina is a 42 year old black female, divorced, [id number], in complaining of vaginal discharge with itching and burning. Has taken Diflucan and a little better, but wants to be painted with gentian violet.  Last pap was negative HPV and NILM 08/19/22   Review of Systems +vaginal discharge with itching and burning Reviewed past medical,surgical, social and family history. Reviewed medications and allergies.     Objective:   Physical Exam BP 115/73 (BP Location: Left Arm, Patient Position: Sitting, Cuff Size: Normal)   Pulse 67   Ht 5\' 5"  (1.651 m)   Wt 182 lb 8 oz (82.8 kg)   LMP 09/04/2022   BMI 30.37 kg/m  Skin warm and dry.Pelvic: external genitalia is normal in appearance no lesions, but irritated, vagina: +blood,urethra has no lesions or masses noted, cervix:smooth and bulbous, uterus: normal size, shape and contour, non tender, no masses felt, adnexa: no masses or tenderness noted. Bladder is non tender and no masses felt. CV Swab obtained. Painted vulva and vagina with gentian violet    Fall risk is low  Upstream - 09/07/22 1122       Pregnancy Intention Screening   Does the patient want to become pregnant in the next year? Ok Either Way    Does the patient's partner want to become pregnant in the next year? Ok Either Way    Would the patient like to discuss contraceptive options today? No      Contraception Wrap Up   Current Method Female Condom    End Method Female Condom            Examination chaperoned by Malachy Mood LPN  Assessment:     1. Vaginal itching CV swab sent Took last diflucan Saturday  - Cervicovaginal ancillary only( [place])  2. Vaginal discharge CV swab sent for GC/CHL,trich,BV and yeast  - Cervicovaginal ancillary only( [place])  3. Yeast vaginitis Painted vulva and vagina with gentian violet   Will wait for CV swab results, for  more meds   Plan:     Follow up in 2 weeks for recheck

## 2022-09-08 ENCOUNTER — Other Ambulatory Visit: Payer: Self-pay | Admitting: Adult Health

## 2022-09-08 LAB — CERVICOVAGINAL ANCILLARY ONLY
Bacterial Vaginitis (gardnerella): POSITIVE — AB
Candida Glabrata: NEGATIVE
Candida Vaginitis: POSITIVE — AB
Chlamydia: NEGATIVE
Comment: NEGATIVE
Comment: NEGATIVE
Comment: NEGATIVE
Comment: NEGATIVE
Comment: NEGATIVE
Comment: NORMAL
Neisseria Gonorrhea: NEGATIVE
Trichomonas: NEGATIVE

## 2022-09-08 MED ORDER — METRONIDAZOLE 500 MG PO TABS: 500.0000 mg | ORAL_TABLET | Freq: Two times a day (BID) | ORAL | 0 refills | Status: DC

## 2022-09-08 NOTE — Progress Notes (Signed)
+  BV and yeast on vaginal swab, will rx flagyl, no sex or alcohol while taking and you have rx for diflucan so take that now too.

## 2022-09-14 ENCOUNTER — Other Ambulatory Visit: Payer: Self-pay | Admitting: Adult Health

## 2022-09-14 MED ORDER — FLUCONAZOLE 150 MG PO TABS: 150.0000 mg | ORAL_TABLET | ORAL | 0 refills | Status: DC | PRN

## 2022-09-14 NOTE — Progress Notes (Signed)
Refill diflucan 

## 2022-09-17 ENCOUNTER — Ambulatory Visit: Payer: Medicaid Other | Admitting: Adult Health

## 2022-09-17 ENCOUNTER — Encounter: Payer: Self-pay | Admitting: Adult Health

## 2022-09-17 ENCOUNTER — Other Ambulatory Visit (HOSPITAL_COMMUNITY)
Admission: RE | Admit: 2022-09-17 | Discharge: 2022-09-17 | Disposition: A | Discharge status: Home / Self Care | Payer: Medicaid Other | Source: Ambulatory Visit | Attending: Adult Health | Admitting: Adult Health

## 2022-09-17 VITALS — BP 124/74 | HR 81 | Ht 65.0 in | Wt 181.5 lb

## 2022-09-17 DIAGNOSIS — B3731 Acute candidiasis of vulva and vagina: Secondary | ICD-10-CM

## 2022-09-17 DIAGNOSIS — Z131 Encounter for screening for diabetes mellitus: Secondary | ICD-10-CM | POA: Diagnosis not present

## 2022-09-17 DIAGNOSIS — R350 Frequency of micturition: Secondary | ICD-10-CM | POA: Insufficient documentation

## 2022-09-17 DIAGNOSIS — N898 Other specified noninflammatory disorders of vagina: Secondary | ICD-10-CM | POA: Insufficient documentation

## 2022-09-17 LAB — POCT URINALYSIS DIPSTICK
Glucose, UA: NEGATIVE
Ketones, UA: NEGATIVE
Protein, UA: POSITIVE — AB

## 2022-09-17 MED ORDER — FLUCONAZOLE 100 MG PO TABS: 100.0000 mg | ORAL_TABLET | Freq: Every day | ORAL | 0 refills | Status: DC

## 2022-09-17 NOTE — Progress Notes (Signed)
  Subjective:     Patient ID: Cynthia Molina, female   DOB: [birth year], 42 y.o.   MRN: [id number]  HPI Cynthia Molina is a 42 year old black female, divorced, [id number] in complaining of vaginal itching and discharge, has been treated for yeast and has urinary frequency.  Last pap was negative HPV, NILM 08/29/22  Review of Systems +vaginal itching +vaginal discharge +urinary frequency No sex in a while Reviewed past medical,surgical, social and family history. Reviewed medications and allergies.     Objective:   Physical Exam BP 124/74 (BP Location: Left Arm, Patient Position: Sitting, Cuff Size: Normal)   Pulse 81   Ht  (1.651 m)   Wt 181 lb 8 oz (82.3 kg)   LMP 09/04/2022   BMI 30.20 kg/m  Urine dipstick trace blood and protein and 2+ leuks Skin warm and dry.Pelvic: external genitalia is normal in appearance, slightly red, vagina: white, clumpy discharge without odor, side walls are red, cleaned discharge out then painted vagina and vulva with gentian violet,urethra has no lesions or masses noted, cervix:smooth and bulbous, uterus: normal size, shape and contour, non tender, no masses felt, adnexa: no masses or tenderness noted. Bladder is non tender and no masses felt.CV swab obtained.     Up[facility] - 09/17/22 0913       Pregnancy Intention Screening   Does the patient want to become pregnant in the next year? Ok Either Way    Does the patient's partner want to become pregnant in the next year? Ok Either Way    Would the patient like to discuss contraceptive options today? No      Contraception Wrap Up   Current Method Female Condom    End Method Female Condom            Examination chaperoned by Malachy Mood LPN  Assessment:     1. Urinary frequency Urine sent for UA C&S to rule out UTI  - POCT Urinalysis Dipstick - Urine Culture - Urinalysis, Routine w reflex microscopic  2. Vaginal itching CV swab sent  - Cervicovaginal ancillary only( [place])  3. Vaginal  discharge CV swab sent for BV and yeast  - Cervicovaginal ancillary only( [place])  4. Vaginal irritation CV swab sent  - Cervicovaginal ancillary only( )  5. Yeast vaginitis Painted with gentian violet and will rx diflucan 100 mg 1 daily for 14 days Meds ordered this encounter  Medications   fluconazole (DIFLUCAN) 100 MG tablet    Sig: Take 1 tablet (100 mg total) by mouth daily for 14 days.    Dispense:  14 tablet    Refill:  0    Order Specific Question:   Supervising Provider    Answer:   Duane Lope H [[id number]]   Do not use soap Sleep without panties   6. Screening for diabetes mellitus Will check A1c to rule out diabetes, since having frequent yeast  - Hemoglobin A1c     Plan:     Follow up in 2 weeks for recheck

## 2022-09-18 LAB — URINALYSIS, ROUTINE W REFLEX MICROSCOPIC
Bilirubin, UA: NEGATIVE
Glucose, UA: NEGATIVE
Ketones, UA: NEGATIVE
Nitrite, UA: NEGATIVE
RBC, UA: NEGATIVE
Specific Gravity, UA: 1.019 (ref 1.005–1.030)
Urobilinogen, Ur: 0.2 mg/dL (ref 0.2–1.0)
pH, UA: 6.5 (ref 5.0–7.5)

## 2022-09-18 LAB — MICROSCOPIC EXAMINATION
Casts: NONE SEEN /lpf
Epithelial Cells (non renal): >10 /hpf — AB (ref 0–10)

## 2022-09-18 LAB — HEMOGLOBIN A1C
Est. average glucose Bld gHb Est-mCnc: 126 mg/dL
Hgb A1c MFr Bld: 6 % — ABNORMAL HIGH (ref 4.8–5.6)

## 2022-09-19 LAB — URINE CULTURE

## 2022-09-21 ENCOUNTER — Other Ambulatory Visit: Payer: Self-pay | Admitting: Adult Health

## 2022-09-21 ENCOUNTER — Ambulatory Visit: Payer: Medicaid Other | Admitting: Adult Health

## 2022-09-21 LAB — CERVICOVAGINAL ANCILLARY ONLY
Bacterial Vaginitis (gardnerella): POSITIVE — AB
Candida Glabrata: NEGATIVE
Candida Vaginitis: POSITIVE — AB
Comment: NEGATIVE
Comment: NEGATIVE
Comment: NEGATIVE

## 2022-09-21 MED ORDER — METRONIDAZOLE 500 MG PO TABS: 500.0000 mg | ORAL_TABLET | Freq: Two times a day (BID) | ORAL | 0 refills | Status: DC

## 2022-09-21 MED ORDER — AMOXICILLIN 500 MG PO CAPS: 500.0000 mg | ORAL_CAPSULE | Freq: Three times a day (TID) | ORAL | 0 refills | Status: DC

## 2022-10-01 ENCOUNTER — Ambulatory Visit: Payer: Medicaid Other | Admitting: Adult Health

## 2022-10-01 ENCOUNTER — Encounter: Payer: Self-pay | Admitting: Adult Health

## 2022-10-01 VITALS — BP 128/76 | HR 86 | Ht 65.0 in | Wt 179.5 lb

## 2022-10-01 DIAGNOSIS — B3731 Acute candidiasis of vulva and vagina: Secondary | ICD-10-CM

## 2022-10-01 DIAGNOSIS — N898 Other specified noninflammatory disorders of vagina: Secondary | ICD-10-CM | POA: Diagnosis not present

## 2022-10-01 MED ORDER — TERCONAZOLE 0.4 % VA CREA: 1.0000 | TOPICAL_CREAM | Freq: Every day | VAGINAL | 0 refills | Status: DC

## 2022-10-01 MED ORDER — NYSTATIN 100000 UNIT/GM EX CREA: 1.0000 | TOPICAL_CREAM | Freq: Two times a day (BID) | CUTANEOUS | 3 refills | Status: AC

## 2022-10-01 NOTE — Progress Notes (Signed)
  Subjective:     Patient ID: [name], female   DOB: Jul 10, 1980, 42 y.o.   MRN: [id number]  HPI Tommi is a 42 year old black female, divorced, [id number] in for check up after being treated for yeast, with gentian violet, was also treated for BV and GBS in urine. Was better but itching on outside now and had clumpy discharge.just finished diflucan.   Last pap was negative HPV,NILM 08/19/22.    Review of Systems + itching +vaginal discharge Reviewed past medical,surgical, social and family history. Reviewed medications and allergies.     Objective:   Physical Exam BP 128/76 (BP Location: Left Arm, Patient Position: Sitting, Cuff Size: Normal)   Pulse 86   Ht 5\' 5"  (1.651 m)   Wt 179 lb 8 oz (81.4 kg)   LMP 09/04/2022   BMI 29.87 kg/m  Skin warm and dry.Pelvic: external genitalia is normal in appearance no lesions, vagina: clumpy yellowish discharge without odor,red sidewalls,urethra has no lesions or masses noted, cervix:smooth and bulbous, uterus: normal size, shape and contour, non tender, no masses felt, adnexa: no masses or tenderness noted. Bladder is non tender and no masses felt.     Up[facility] - 10/01/22 0943       Pregnancy Intention Screening   Does the patient want to become pregnant in the next year? No    Does the patient's partner want to become pregnant in the next year? No    Would the patient like to discuss contraceptive options today? No      Contraception Wrap Up   Current Method Female Condom    End Method Female Condom            Examination chaperoned by Malachy Mood LPN  Assessment:     1. Yeast vaginitis Will rx Terazol cream   2. Vaginal itching Has more external itching now Will rx nystatin cream Meds ordered this encounter  Medications   nystatin cream (MYCOSTATIN)    Sig: Apply 1 Application topically 2 (two) times daily.    Dispense:  30 g    Refill:  3    Order Specific Question:   Supervising Provider    Answer:   Despina Hidden, LUTHER H  [[id number]]   terconazole (TERAZOL 7) 0.4 % vaginal cream    Sig: Place 1 applicator vaginally at bedtime.    Dispense:  45 g    Refill:  0    Order Specific Question:   Supervising Provider    Answer:   Despina Hidden, LUTHER H [[id number]]     3. Vaginal discharge     Plan:     Will recheck in 1 week, if persists may try boric acid supp and diflucan weekly

## 2022-10-08 ENCOUNTER — Ambulatory Visit: Payer: Medicaid Other | Admitting: Adult Health

## 2022-10-08 ENCOUNTER — Encounter: Payer: Self-pay | Admitting: Adult Health

## 2022-10-08 VITALS — BP 123/71 | HR 67 | Ht 65.0 in | Wt 179.0 lb

## 2022-10-08 DIAGNOSIS — B3731 Acute candidiasis of vulva and vagina: Secondary | ICD-10-CM

## 2022-10-08 DIAGNOSIS — N898 Other specified noninflammatory disorders of vagina: Secondary | ICD-10-CM | POA: Diagnosis not present

## 2022-10-08 NOTE — Progress Notes (Signed)
  Subjective:     Patient ID: Cynthia Molina, female   DOB: 1980-10-28, 42 y.o.   MRN: [id number]  HPI Cynthia Molina is a 42 year old black female, divorced, [phone], back in follow up after being treated for yeast infection has been recurrent, no itching,discharge, or odor now, feels much better.  Last pap was negative HPV and NILM 08/19/22  Review of Systems Itching and discharge has resolved, feel much better Reviewed past medical,surgical, social and family history. Reviewed medications and allergies.     Objective:   Physical Exam BP 123/71 (BP Location: Left Arm, Patient Position: Sitting, Cuff Size: Normal)   Pulse 67   Ht 5\' 5"  (1.651 m)   Wt 179 lb (81.2 kg)   LMP 10/01/2022   BMI 29.79 kg/m   Skin warm and dry.Pelvic: external genitalia is normal in appearance no lesions, vagina: pink,urethra has no lesions or masses noted, cervix:smooth and bulbous, uterus: normal size, shape and contour, non tender, no masses felt, adnexa: no masses or tenderness noted. Bladder is non tender and no masses felt.    No yeast seen  Upstream - 10/08/22 6295       Pregnancy Intention Screening   Does the patient want to become pregnant in the next year? Ok Either Way    Does the patient's partner want to become pregnant in the next year? Ok Either Way    Would the patient like to discuss contraceptive options today? No      Contraception Wrap Up   Current Method Female Condom    End Method Female Condom            Examination chaperoned by Malachy Mood LPN  Assessment:     1. Vaginal itching Has resolved  2. Yeast vaginitis Has resolved, feels much better     Plan:     Follow up in 1 week at her request

## 2022-10-12 ENCOUNTER — Telehealth: Payer: Medicaid Other | Admitting: Family Medicine

## 2022-10-12 DIAGNOSIS — B3731 Acute candidiasis of vulva and vagina: Secondary | ICD-10-CM

## 2022-10-12 NOTE — Progress Notes (Signed)
Because you are continuing to have yeast infections and just were treated by your GYN- please call them and let them know your symptoms have returned. Or you can visit a local Urgent Care. Your condition warrants further evaluation and I recommend that you be seen in a face to face visit.   NOTE: There will be NO CHARGE for this eVisit

## 2022-10-13 ENCOUNTER — Other Ambulatory Visit: Payer: Self-pay | Admitting: *Deleted

## 2022-10-13 ENCOUNTER — Ambulatory Visit: Payer: Medicaid Other | Admitting: Adult Health

## 2022-10-13 ENCOUNTER — Encounter: Payer: Self-pay | Admitting: Adult Health

## 2022-10-13 VITALS — BP 134/84 | HR 80 | Ht 65.0 in | Wt 179.0 lb

## 2022-10-13 DIAGNOSIS — B3731 Acute candidiasis of vulva and vagina: Secondary | ICD-10-CM | POA: Insufficient documentation

## 2022-10-13 DIAGNOSIS — N898 Other specified noninflammatory disorders of vagina: Secondary | ICD-10-CM | POA: Diagnosis not present

## 2022-10-13 DIAGNOSIS — R102 Pelvic and perineal pain: Secondary | ICD-10-CM

## 2022-10-13 MED ORDER — VIVJOA 150 MG PO CPPK: ORAL_CAPSULE | ORAL | 0 refills | Status: DC

## 2022-10-13 NOTE — Progress Notes (Addendum)
  Subjective:     Patient ID: [name], female   DOB: July 04, 1980, 42 y.o.   MRN: [id number]  HPI Cynthia Molina is a 42 year old black female, single [id number], in complaining of vaginal pain and vaginal discharge, started about Friday and has gotten worse. Was better last week after taking diflucan for 14 days  and using Terazol 7 and after period. Has had recurrent yeast infections.      Component Value Date/Time   DIAGPAP  08/19/2022 1002    - Negative for intraepithelial lesion or malignancy (NILM)   DIAGPAP - Low grade squamous intraepithelial lesion (LSIL) (A) 11/14/2019 1029   DIAGPAP  09/13/2017 0000    NEGATIVE FOR INTRAEPITHELIAL LESIONS OR MALIGNANCY.   HPVHIGH Negative 08/19/2022 1002   HPVHIGH Positive (A) 11/14/2019 1029   ADEQPAP  08/19/2022 1002    Satisfactory for evaluation; transformation zone component ABSENT.   ADEQPAP  11/14/2019 1029    Satisfactory for evaluation; transformation zone component ABSENT.   ADEQPAP  09/13/2017 0000    Satisfactory for evaluation  endocervical/transformation zone component PRESENT.   Review of Systems Has vaginal discharge +vaginal pain  Reviewed past medical,surgical, social and family history. Reviewed medications and allergies.     Objective:   Physical Exam BP 134/84 (BP Location: Left Arm, Patient Position: Sitting, Cuff Size: Normal)   Pulse 80   Ht 5\' 5"  (1.651 m)   Wt 179 lb (81.2 kg)   LMP 10/01/2022   BMI 29.79 kg/m     Skin warm and dry.Pelvic: external genitalia is normal in appearance no lesions, vagina: yellowish discharge clings to sidewall and they are red,no odor,urethra has no lesions or masses noted, cervix: bulbous, uterus: normal size, shape and contour, non tender, no masses felt, adnexa: no masses or tenderness noted. Bladder is non tender and no masses felt. Dr Despina Hidden for co exam, painted vagina and inner labia with gentian violet. Fall risk is low  Upstream - 10/13/22 1504       Pregnancy Intention  Screening   Does the patient want to become pregnant in the next year? No    Does the patient's partner want to become pregnant in the next year? No    Would the patient like to discuss contraceptive options today? No      Contraception Wrap Up   Current Method Female Condom    End Method Female Condom            Examination chaperoned by Malachy Mood LPN  Assessment:     1. Yeast vaginitis Painted with gentian violet  2. Vaginal irritation  3. Vaginal discharge  4. Vaginal pain  5. Recurrent candidiasis of vagina Co exam with Dr Despina Hidden No sex while taking meds  Will treat with Vivjoa per Dr Despina Hidden  Meds ordered this encounter  Medications   Oteseconazole, 12 Week, (VIVJOA) 150 MG CPPK    Sig: Take 4 tabs po day 1 then 3 tabs day 2, then none til day 14, then take 1 tablet weekly for 11 weeks    Dispense:  18 each    Refill:  0    Order Specific Question:   Supervising Provider    Answer:   Lazaro Arms [[id number]]       Plan:     Follow up  in 4 weeks

## 2022-10-14 ENCOUNTER — Encounter: Payer: Self-pay | Admitting: *Deleted

## 2022-10-14 ENCOUNTER — Encounter: Payer: Self-pay | Admitting: Adult Health

## 2022-10-15 ENCOUNTER — Ambulatory Visit: Payer: Medicaid Other | Admitting: Adult Health

## 2022-10-15 ENCOUNTER — Telehealth: Payer: Self-pay | Admitting: *Deleted

## 2022-10-15 ENCOUNTER — Other Ambulatory Visit: Payer: Self-pay | Admitting: Adult Health

## 2022-10-15 MED ORDER — TERCONAZOLE 0.4 % VA CREA: 1.0000 | TOPICAL_CREAM | Freq: Every day | VAGINAL | 0 refills | Status: DC

## 2022-10-15 MED ORDER — FLUCONAZOLE 100 MG PO TABS: 100.0000 mg | ORAL_TABLET | Freq: Every day | ORAL | 0 refills | Status: AC

## 2022-10-15 NOTE — Progress Notes (Signed)
Rx diflucan.  

## 2022-10-15 NOTE — Telephone Encounter (Signed)
Pt don't feel like the Diflucan alone helped. Can you prescribe Terazol cream? Also, would taking the Diflucan & Terazol at the same time help? Pt is going to call [facility] and ask if she can pay for partial prescription of the Vivjoa. Please advise. Thanks! JSY

## 2022-10-15 NOTE — Telephone Encounter (Signed)
Will rx terazol 

## 2022-10-20 ENCOUNTER — Encounter: Payer: Self-pay | Admitting: *Deleted

## 2022-10-30 ENCOUNTER — Telehealth: Payer: Self-pay | Admitting: *Deleted

## 2022-10-30 NOTE — Telephone Encounter (Signed)
I spoke with insurance regarding Vivjoa. It requires a second appeal. I faxed info again. Pt aware. JSY

## 2022-11-03 ENCOUNTER — Other Ambulatory Visit: Payer: Self-pay | Admitting: Adult Health

## 2022-11-03 ENCOUNTER — Telehealth: Payer: Self-pay | Admitting: *Deleted

## 2022-11-03 MED ORDER — TERCONAZOLE 0.4 % VA CREA: 1.0000 | TOPICAL_CREAM | Freq: Every day | VAGINAL | 0 refills | Status: DC

## 2022-11-03 MED ORDER — FLUCONAZOLE 100 MG PO TABS: 100.0000 mg | ORAL_TABLET | Freq: Every day | ORAL | 0 refills | Status: DC

## 2022-11-03 NOTE — Telephone Encounter (Signed)
Pt's insurance has denied covering Vivjoa because pt is of child bearing age. Pt aware. JSY

## 2022-11-03 NOTE — Progress Notes (Signed)
Will refill terazol and rx diflucan

## 2022-11-04 NOTE — Telephone Encounter (Signed)
Pt's insurance is not going to cover Vivjoa because pt is of childbearing age. Pt aware. JSY

## 2022-11-10 ENCOUNTER — Ambulatory Visit: Payer: Medicaid Other | Admitting: Adult Health

## 2022-11-10 ENCOUNTER — Encounter: Payer: Self-pay | Admitting: Adult Health

## 2022-11-10 ENCOUNTER — Other Ambulatory Visit (HOSPITAL_COMMUNITY)
Admission: RE | Admit: 2022-11-10 | Discharge: 2022-11-10 | Disposition: A | Discharge status: Home / Self Care | Payer: Medicaid Other | Source: Ambulatory Visit | Attending: Adult Health | Admitting: Adult Health

## 2022-11-10 VITALS — BP 129/70 | HR 77 | Ht 65.0 in | Wt 179.0 lb

## 2022-11-10 DIAGNOSIS — N898 Other specified noninflammatory disorders of vagina: Secondary | ICD-10-CM | POA: Insufficient documentation

## 2022-11-10 DIAGNOSIS — B3731 Acute candidiasis of vulva and vagina: Secondary | ICD-10-CM

## 2022-11-10 NOTE — Progress Notes (Signed)
  Subjective:     Patient ID: Cynthia Molina, female   DOB: 04/25/1981, 42 y.o.   MRN: [id number]  HPI Eveny is a 42 year old black female, divorced, [id number] in for recheck on vaginal yeast, has had recurrent infections. She is currently taking diflucan 100 mg daily for 14 days and using terazol 7 cream, her insurance denied Vivjoa.     Component Value Date/Time   DIAGPAP  08/19/2022 1002    - Negative for intraepithelial lesion or malignancy (NILM)   DIAGPAP - Low grade squamous intraepithelial lesion (LSIL) (A) 11/14/2019 1029   DIAGPAP  09/13/2017 0000    NEGATIVE FOR INTRAEPITHELIAL LESIONS OR MALIGNANCY.   HPVHIGH Negative 08/19/2022 1002   HPVHIGH Positive (A) 11/14/2019 1029   ADEQPAP  08/19/2022 1002    Satisfactory for evaluation; transformation zone component ABSENT.   ADEQPAP  11/14/2019 1029    Satisfactory for evaluation; transformation zone component ABSENT.   ADEQPAP  09/13/2017 0000    Satisfactory for evaluation  endocervical/transformation zone component PRESENT.    Review of Systems Feels better at present Reviewed past medical,surgical, social and family history. Reviewed medications and allergies.     Objective:   Physical Exam     Assessment:     1. Recurrent candidiasis of vagina CV swab sent Continue diflucan and terazol  May try amphotericin 4% cram 1 applicator in vagina for 14 days if persists since Vivjoa was denied - Cervicovaginal ancillary only( [place])  2. Vaginal discharge CV swab sent for BV and yeast  - Cervicovaginal ancillary only( [place])     Plan:     Follow up in 1 week

## 2022-11-11 LAB — CERVICOVAGINAL ANCILLARY ONLY
Bacterial Vaginitis (gardnerella): POSITIVE — AB
Candida Glabrata: NEGATIVE
Candida Vaginitis: POSITIVE — AB
Comment: NEGATIVE
Comment: NEGATIVE
Comment: NEGATIVE

## 2022-11-12 ENCOUNTER — Other Ambulatory Visit: Payer: Self-pay | Admitting: Adult Health

## 2022-11-12 MED ORDER — FLUCONAZOLE 100 MG PO TABS: 100.0000 mg | ORAL_TABLET | Freq: Every day | ORAL | 0 refills | Status: DC

## 2022-11-12 MED ORDER — METRONIDAZOLE 500 MG PO TABS: 500.0000 mg | ORAL_TABLET | Freq: Two times a day (BID) | ORAL | 0 refills | Status: DC

## 2022-11-12 NOTE — Progress Notes (Signed)
+  BV and yeast on vaginal swab, will rx flagyl and diflucan 

## 2022-11-16 ENCOUNTER — Other Ambulatory Visit: Payer: Self-pay | Admitting: Adult Health

## 2022-11-16 MED ORDER — FLUCONAZOLE 100 MG PO TABS: 100.0000 mg | ORAL_TABLET | Freq: Every day | ORAL | 0 refills | Status: AC

## 2022-11-16 MED ORDER — TERCONAZOLE 0.4 % VA CREA: 1.0000 | TOPICAL_CREAM | Freq: Every day | VAGINAL | 1 refills | Status: DC

## 2022-11-16 NOTE — Progress Notes (Signed)
Refill diflucan 

## 2022-11-16 NOTE — Progress Notes (Signed)
Refilled terazol 7

## 2022-11-17 ENCOUNTER — Ambulatory Visit: Payer: Medicaid Other | Admitting: Adult Health

## 2022-11-23 ENCOUNTER — Ambulatory Visit: Payer: Medicaid Other | Admitting: Adult Health

## 2022-11-23 ENCOUNTER — Other Ambulatory Visit: Payer: Self-pay | Admitting: Adult Health

## 2022-11-23 MED ORDER — METRONIDAZOLE 0.75 % VA GEL: 1.0000 | Freq: Every day | VAGINAL | 0 refills | Status: DC

## 2022-11-23 NOTE — Progress Notes (Signed)
Rx sent in for metrogel.  

## 2022-11-30 ENCOUNTER — Ambulatory Visit: Payer: Medicaid Other | Admitting: Adult Health

## 2022-11-30 ENCOUNTER — Encounter: Payer: Self-pay | Admitting: Adult Health

## 2022-11-30 ENCOUNTER — Other Ambulatory Visit (HOSPITAL_COMMUNITY)
Admission: RE | Admit: 2022-11-30 | Discharge: 2022-11-30 | Disposition: A | Discharge status: Home / Self Care | Payer: Medicaid Other | Source: Ambulatory Visit | Attending: Adult Health | Admitting: Adult Health

## 2022-11-30 VITALS — BP 128/69 | HR 81 | Ht 65.0 in | Wt 179.0 lb

## 2022-11-30 DIAGNOSIS — N898 Other specified noninflammatory disorders of vagina: Secondary | ICD-10-CM | POA: Diagnosis not present

## 2022-11-30 DIAGNOSIS — B3731 Acute candidiasis of vulva and vagina: Secondary | ICD-10-CM | POA: Insufficient documentation

## 2022-11-30 NOTE — Progress Notes (Signed)
  Subjective:     Patient ID: Cynthia Molina, female   DOB: 03-17-1981, 42 y.o.   MRN: [id number]  HPI Cynthia Molina is a 42 year old black female, divorced, [id number] back in follow up on recurrent yeast, has discharge, no itching finished diflucan today, has taken for 14 days.     Component Value Date/Time   DIAGPAP  08/19/2022 1002    - Negative for intraepithelial lesion or malignancy (NILM)   DIAGPAP - Low grade squamous intraepithelial lesion (LSIL) (A) 11/14/2019 1029   DIAGPAP  09/13/2017 0000    NEGATIVE FOR INTRAEPITHELIAL LESIONS OR MALIGNANCY.   HPVHIGH Negative 08/19/2022 1002   HPVHIGH Positive (A) 11/14/2019 1029   ADEQPAP  08/19/2022 1002    Satisfactory for evaluation; transformation zone component ABSENT.   ADEQPAP  11/14/2019 1029    Satisfactory for evaluation; transformation zone component ABSENT.   ADEQPAP  09/13/2017 0000    Satisfactory for evaluation  endocervical/transformation zone component PRESENT.     Review of Systems +discharge Reviewed past medical,surgical, social and family history. Reviewed medications and allergies.     Objective:   Physical Exam BP 128/69 (BP Location: Right Arm, Patient Position: Sitting, Cuff Size: Normal)   Pulse 81   Ht 5\' 5"  (1.651 m)   Wt 179 lb (81.2 kg)   LMP 11/23/2022 (Approximate)   BMI 29.79 kg/m  Skin warm and dry.Pelvic: external genitalia is normal in appearance no lesions, vagina: white clumpy,dry discharge without odor,red sidewalls, used Q tip to clean discharge out of vault and then painted with gentian violet,urethra has no lesions or masses noted, cervix:smooth and bulbous, uterus: normal size, shape and contour, non tender, no masses felt, adnexa: no masses or tenderness noted. Bladder is non tender and no masses felt.    Fall risk is low  Upstream - 11/30/22 1201       Pregnancy Intention Screening   Does the patient want to become pregnant in the next year? Ok Either Way    Does the patient's partner  want to become pregnant in the next year? Ok Either Way    Would the patient like to discuss contraceptive options today? No      Contraception Wrap Up   Current Method Female Condom    End Method Female Condom            Examination chaperoned by Malachy Mood LPN Assessment:     1. Recurrent candidiasis of vagina CV swab sent Painted with gentian violet after discharge cleaned out - Cervicovaginal ancillary only( [place])  2. Vaginal discharge CV swab sent for BV and yeast  - Cervicovaginal ancillary only( [place])     Plan:     Follow up in 1 week for recheck

## 2022-12-01 ENCOUNTER — Other Ambulatory Visit: Payer: Self-pay | Admitting: Adult Health

## 2022-12-01 LAB — CERVICOVAGINAL ANCILLARY ONLY
Bacterial Vaginitis (gardnerella): NEGATIVE
Candida Glabrata: NEGATIVE
Candida Vaginitis: POSITIVE — AB
Comment: NEGATIVE
Comment: NEGATIVE
Comment: NEGATIVE

## 2022-12-01 MED ORDER — GYNAZOLE-1 2 % VA CREA: TOPICAL_CREAM | VAGINAL | 1 refills | Status: DC

## 2022-12-01 NOTE — Progress Notes (Signed)
Rx gynazole +yeast on vaginal swab

## 2022-12-08 ENCOUNTER — Ambulatory Visit: Payer: Medicaid Other | Admitting: Adult Health

## 2022-12-08 ENCOUNTER — Encounter: Payer: Self-pay | Admitting: Adult Health

## 2022-12-08 ENCOUNTER — Other Ambulatory Visit (HOSPITAL_COMMUNITY)
Admission: RE | Admit: 2022-12-08 | Discharge: 2022-12-08 | Disposition: A | Discharge status: Home / Self Care | Payer: Medicaid Other | Source: Ambulatory Visit | Attending: Adult Health | Admitting: Adult Health

## 2022-12-08 VITALS — BP 129/81 | HR 90 | Ht 65.0 in | Wt 179.0 lb

## 2022-12-08 DIAGNOSIS — B3731 Acute candidiasis of vulva and vagina: Secondary | ICD-10-CM | POA: Diagnosis not present

## 2022-12-08 MED ORDER — FLUCONAZOLE 150 MG PO TABS: ORAL_TABLET | ORAL | 1 refills | Status: DC

## 2022-12-08 NOTE — Progress Notes (Signed)
  Subjective:     Patient ID: Cynthia Molina, female   DOB: Jun 22, 1980, 42 y.o.   MRN: [id number]  HPI Cynthia Molina is a 42 year old black female,divorced, [id number], back in follow up after using gynazole 1 and has used Terazol 7 since then, has itching and discharge.     Component Value Date/Time   DIAGPAP  08/19/2022 1002    - Negative for intraepithelial lesion or malignancy (NILM)   DIAGPAP - Low grade squamous intraepithelial lesion (LSIL) (A) 11/14/2019 1029   DIAGPAP  09/13/2017 0000    NEGATIVE FOR INTRAEPITHELIAL LESIONS OR MALIGNANCY.   HPVHIGH Negative 08/19/2022 1002   HPVHIGH Positive (A) 11/14/2019 1029   ADEQPAP  08/19/2022 1002    Satisfactory for evaluation; transformation zone component ABSENT.   ADEQPAP  11/14/2019 1029    Satisfactory for evaluation; transformation zone component ABSENT.   ADEQPAP  09/13/2017 0000    Satisfactory for evaluation  endocervical/transformation zone component PRESENT.    Review of Systems +vaginal discharge and itching Reviewed past medical,surgical, social and family history. Reviewed medications and allergies.     Objective:   Physical Exam BP 129/81 (BP Location: Left Arm, Patient Position: Sitting, Cuff Size: Normal)   Pulse 90   Ht 5\' 5"  (1.651 m)   Wt 179 lb (81.2 kg)   LMP 11/23/2022 (Approximate)   BMI 29.79 kg/m     Skin warm and dry.Pelvic: external genitalia is normal in appearance no lesions, vagina: scant white discharge without odor,has red side walls,urethra has no lesions or masses noted, cervix:smooth and bulbous, uterus: normal size, shape and contour, non tender, no masses felt, adnexa: no masses or tenderness noted. Bladder is non tender and no masses felt.CV swab sent for BV and yeast.  Upstream - 12/08/22 1620       Pregnancy Intention Screening   Does the patient want to become pregnant in the next year? Ok Either Way    Does the patient's partner want to become pregnant in the next year? Ok Either Way     Would the patient like to discuss contraceptive options today? No      Contraception Wrap Up   Current Method Female Condom    End Method Female Condom            Examination chaperoned by Malachy Mood LPN Assessment:     1. Recurrent candidiasis of vagina +discharge and itching CV swab sent for BV and yeast Will rx diflucan Meds ordered this encounter  Medications   fluconazole (DIFLUCAN) 150 MG tablet    Sig: Take 1 now and repeat 1 in 3 days    Dispense:  2 tablet    Refill:  1    Order Specific Question:   Supervising Provider    Answer:   Duane Lope H [[id number]]    - Cervicovaginal ancillary only( [place])     Plan:     Follow up in 1 week if needed

## 2022-12-10 ENCOUNTER — Other Ambulatory Visit: Payer: Self-pay | Admitting: Adult Health

## 2022-12-10 LAB — CERVICOVAGINAL ANCILLARY ONLY
Bacterial Vaginitis (gardnerella): POSITIVE — AB
Candida Glabrata: NEGATIVE
Candida Vaginitis: POSITIVE — AB
Comment: NEGATIVE
Comment: NEGATIVE
Comment: NEGATIVE

## 2022-12-10 MED ORDER — METRONIDAZOLE 500 MG PO TABS: 500.0000 mg | ORAL_TABLET | Freq: Two times a day (BID) | ORAL | 0 refills | Status: DC

## 2022-12-10 NOTE — Progress Notes (Signed)
+  BV and yeast on vaginal swab, already has diflucan, will rx flagyl, no sex or alcohol while taking flagyl

## 2022-12-15 ENCOUNTER — Ambulatory Visit: Payer: Medicaid Other | Admitting: Adult Health

## 2022-12-15 ENCOUNTER — Encounter: Payer: Self-pay | Admitting: Adult Health

## 2022-12-15 ENCOUNTER — Other Ambulatory Visit (HOSPITAL_COMMUNITY)
Admission: RE | Admit: 2022-12-15 | Discharge: 2022-12-15 | Disposition: A | Discharge status: Home / Self Care | Payer: Medicaid Other | Source: Ambulatory Visit | Attending: Adult Health | Admitting: Adult Health

## 2022-12-15 VITALS — BP 123/74 | HR 82 | Ht 65.0 in | Wt 179.0 lb

## 2022-12-15 DIAGNOSIS — N898 Other specified noninflammatory disorders of vagina: Secondary | ICD-10-CM | POA: Insufficient documentation

## 2022-12-15 DIAGNOSIS — B3731 Acute candidiasis of vulva and vagina: Secondary | ICD-10-CM | POA: Insufficient documentation

## 2022-12-15 NOTE — Progress Notes (Signed)
  Subjective:     Patient ID: Cynthia Molina, female   DOB: 1980-09-29, 42 y.o.   MRN: [id number]  HPI Cynthia Molina is a 42 year old black female,divorced, [phone] in for recurrent vaginal irritation,(last diflucan was Sunday) has had recurrent yeast and BV.     Component Value Date/Time   DIAGPAP  08/19/2022 1002    - Negative for intraepithelial lesion or malignancy (NILM)   DIAGPAP - Low grade squamous intraepithelial lesion (LSIL) (A) 11/14/2019 1029   DIAGPAP  09/13/2017 0000    NEGATIVE FOR INTRAEPITHELIAL LESIONS OR MALIGNANCY.   HPVHIGH Negative 08/19/2022 1002   HPVHIGH Positive (A) 11/14/2019 1029   ADEQPAP  08/19/2022 1002    Satisfactory for evaluation; transformation zone component ABSENT.   ADEQPAP  11/14/2019 1029    Satisfactory for evaluation; transformation zone component ABSENT.   ADEQPAP  09/13/2017 0000    Satisfactory for evaluation  endocervical/transformation zone component PRESENT.   Review of Systems +vaginal irritation Reviewed past medical,surgical, social and family history. Reviewed medications and allergies.     Objective:   Physical Exam BP 123/74 (BP Location: Left Arm, Patient Position: Sitting, Cuff Size: Normal)   Pulse 82   Ht 5\' 5"  (1.651 m)   Wt 179 lb (81.2 kg)   LMP 11/23/2022 (Approximate)   BMI 29.79 kg/m     Skin warm and dry.Pelvic: external genitalia is normal in appearance no lesions, vagina: white discharge,not clumpy, without odor,urethra has no lesions or masses noted, cervix:smooth and bulbous, uterus: normal size, shape and contour, non tender, no masses felt, adnexa: no masses or tenderness noted. Bladder is non tender and no masses felt.CV swab obtained.  Upstream - 12/15/22 1510       Pregnancy Intention Screening   Does the patient want to become pregnant in the next year? Ok Either Way    Does the patient's partner want to become pregnant in the next year? Ok Either Way    Would the patient like to discuss contraceptive  options today? No      Contraception Wrap Up   Current Method Female Condom    End Method Female Condom            Examination chaperoned by Malachy Mood LPN  Assessment:     1. Recurrent candidiasis of vagina CV swab sent for BV and yeast  - Cervicovaginal ancillary only( [place])  2. Vaginal discharge CV swab sent for BV and yeast  - Cervicovaginal ancillary only( [place])  3. Vaginal irritation Last diflucan Sunday Has refill on diflucan     Plan:     Follow up in 1 week at her request

## 2022-12-17 ENCOUNTER — Other Ambulatory Visit: Payer: Self-pay | Admitting: Adult Health

## 2022-12-17 LAB — CERVICOVAGINAL ANCILLARY ONLY
Bacterial Vaginitis (gardnerella): POSITIVE — AB
Candida Glabrata: NEGATIVE
Candida Vaginitis: POSITIVE — AB
Comment: NEGATIVE
Comment: NEGATIVE
Comment: NEGATIVE

## 2022-12-17 MED ORDER — FLUCONAZOLE 100 MG PO TABS: 100.0000 mg | ORAL_TABLET | Freq: Every day | ORAL | 1 refills | Status: AC

## 2022-12-17 MED ORDER — METRONIDAZOLE 0.75 % VA GEL: 1.0000 | Freq: Every day | VAGINAL | 0 refills | Status: DC

## 2022-12-17 NOTE — Progress Notes (Signed)
+  BV and yeast, will rx diflucan and metrogel

## 2022-12-22 ENCOUNTER — Ambulatory Visit: Payer: Medicaid Other | Admitting: Adult Health

## 2022-12-22 ENCOUNTER — Other Ambulatory Visit (HOSPITAL_COMMUNITY)
Admission: RE | Admit: 2022-12-22 | Discharge: 2022-12-22 | Disposition: A | Discharge status: Home / Self Care | Payer: Medicaid Other | Source: Ambulatory Visit | Attending: Adult Health | Admitting: Adult Health

## 2022-12-22 ENCOUNTER — Encounter: Payer: Self-pay | Admitting: Adult Health

## 2022-12-22 VITALS — BP 127/76 | HR 88 | Ht 65.0 in | Wt 179.0 lb

## 2022-12-22 DIAGNOSIS — R7309 Other abnormal glucose: Secondary | ICD-10-CM | POA: Diagnosis not present

## 2022-12-22 DIAGNOSIS — Z113 Encounter for screening for infections with a predominantly sexual mode of transmission: Secondary | ICD-10-CM

## 2022-12-22 DIAGNOSIS — B3731 Acute candidiasis of vulva and vagina: Secondary | ICD-10-CM | POA: Diagnosis not present

## 2022-12-22 NOTE — Progress Notes (Signed)
  Subjective:     Patient ID: Cynthia Molina, female   DOB: 1980-07-10, 42 y.o.   MRN: [id number]  HPI [name] is a 42 year old black female ,divorced, [id number] in for recheck on recurrent yeast and Recent BV(used Metrogel). On period now, doing OK.    Component Value Date/Time   DIAGPAP  08/19/2022 1002    - Negative for intraepithelial lesion or malignancy (NILM)   DIAGPAP - Low grade squamous intraepithelial lesion (LSIL) (A) 11/14/2019 1029   DIAGPAP  09/13/2017 0000    NEGATIVE FOR INTRAEPITHELIAL LESIONS OR MALIGNANCY.   HPVHIGH Negative 08/19/2022 1002   HPVHIGH Positive (A) 11/14/2019 1029   ADEQPAP  08/19/2022 1002    Satisfactory for evaluation; transformation zone component ABSENT.   ADEQPAP  11/14/2019 1029    Satisfactory for evaluation; transformation zone component ABSENT.   ADEQPAP  09/13/2017 0000    Satisfactory for evaluation  endocervical/transformation zone component PRESENT.     Review of Systems On period, no discharge  Not having sex at present  Reviewed past medical,surgical, social and family history. Reviewed medications and allergies.     Objective:   Physical Exam BP 127/76 (BP Location: Left Arm, Patient Position: Sitting, Cuff Size: Normal)   Pulse 88   Ht 5\' 5"  (1.651 m)   Wt 179 lb (81.2 kg)   LMP 12/18/2022 (Approximate)   BMI 29.79 kg/m     Skin warm and dry.Pelvic: external genitalia is normal in appearance no lesions, vagina:+blood, no odor,urethra has no lesions or masses noted, cervix:smooth and bulbous, uterus: normal size, shape and contour, non tender, no masses felt, adnexa: no masses or tenderness noted. Bladder is non tender and no masses felt.CV swab obtained.   Upstream - 12/22/22 1136       Pregnancy Intention Screening   Does the patient want to become pregnant in the next year? Ok Either Way    Does the patient's partner want to become pregnant in the next year? Ok Either Way    Would the patient like to discuss  contraceptive options today? No      Contraception Wrap Up   Current Method Female Condom    End Method Female Condom            Examination chaperoned by Cynthia Mood LPN  Assessment:     1. Recurrent candidiasis of vagina CV swab sent for BV and yeast  - Cervicovaginal ancillary only( [place])  2. Screening examination for STD (sexually transmitted disease) Check HIV and RPR - HIV Antibody (routine testing w rflx) - RPR  3. Elevated hemoglobin A1c Check A1c was 6, 3 months ago  - Hemoglobin A1c     Plan:     Follow up in about 2 weeks

## 2022-12-23 LAB — HEMOGLOBIN A1C
Est. average glucose Bld gHb Est-mCnc: 114 mg/dL
Hgb A1c MFr Bld: 5.6 % (ref 4.8–5.6)

## 2022-12-23 LAB — HIV ANTIBODY (ROUTINE TESTING W REFLEX): HIV Screen 4th Generation wRfx: NONREACTIVE

## 2022-12-23 LAB — CERVICOVAGINAL ANCILLARY ONLY
Bacterial Vaginitis (gardnerella): NEGATIVE
Candida Glabrata: NEGATIVE
Candida Vaginitis: POSITIVE — AB
Comment: NEGATIVE
Comment: NEGATIVE
Comment: NEGATIVE

## 2022-12-23 LAB — RPR: RPR Ser Ql: NONREACTIVE

## 2022-12-25 ENCOUNTER — Other Ambulatory Visit: Payer: Self-pay | Admitting: Adult Health

## 2022-12-25 MED ORDER — TERCONAZOLE 0.4 % VA CREA: 1.0000 | TOPICAL_CREAM | Freq: Every day | VAGINAL | 0 refills | Status: DC

## 2022-12-25 NOTE — Progress Notes (Signed)
Rx terazol  

## 2022-12-30 ENCOUNTER — Encounter: Payer: Self-pay | Admitting: *Deleted

## 2023-01-07 ENCOUNTER — Encounter: Payer: Self-pay | Admitting: Adult Health

## 2023-01-07 ENCOUNTER — Ambulatory Visit: Payer: Medicaid Other | Admitting: Adult Health

## 2023-01-07 ENCOUNTER — Other Ambulatory Visit (HOSPITAL_COMMUNITY)
Admission: RE | Admit: 2023-01-07 | Discharge: 2023-01-07 | Disposition: A | Discharge status: Home / Self Care | Payer: Medicaid Other | Source: Ambulatory Visit | Attending: Adult Health | Admitting: Adult Health

## 2023-01-07 VITALS — BP 127/80 | HR 109 | Ht 65.0 in | Wt 178.0 lb

## 2023-01-07 DIAGNOSIS — B3731 Acute candidiasis of vulva and vagina: Secondary | ICD-10-CM

## 2023-01-07 DIAGNOSIS — R350 Frequency of micturition: Secondary | ICD-10-CM | POA: Diagnosis not present

## 2023-01-07 DIAGNOSIS — N898 Other specified noninflammatory disorders of vagina: Secondary | ICD-10-CM | POA: Diagnosis not present

## 2023-01-07 LAB — POCT URINALYSIS DIPSTICK
Glucose, UA: NEGATIVE
Nitrite, UA: NEGATIVE
Protein, UA: POSITIVE — AB

## 2023-01-07 MED ORDER — TERCONAZOLE 0.4 % VA CREA: 1.0000 | TOPICAL_CREAM | Freq: Every day | VAGINAL | 0 refills | Status: DC

## 2023-01-07 MED ORDER — SULFAMETHOXAZOLE-TRIMETHOPRIM 800-160 MG PO TABS: 1.0000 | ORAL_TABLET | Freq: Two times a day (BID) | ORAL | 0 refills | Status: DC

## 2023-01-07 NOTE — Progress Notes (Signed)
Subjective:     Patient ID: Cynthia Molina, female   DOB: 1981-04-21, 42 y.o.   MRN: [id number]  HPI Cynthia Molina is a 42 year old black female, divorced, [phone] in complaining of urinary frequency and vaginal irritation, has recurrent yeast. She is taking diflucan.     Component Value Date/Time   DIAGPAP  08/19/2022 1002    - Negative for intraepithelial lesion or malignancy (NILM)   DIAGPAP - Low grade squamous intraepithelial lesion (LSIL) (A) 11/14/2019 1029   DIAGPAP  09/13/2017 0000    NEGATIVE FOR INTRAEPITHELIAL LESIONS OR MALIGNANCY.   HPVHIGH Negative 08/19/2022 1002   HPVHIGH Positive (A) 11/14/2019 1029   ADEQPAP  08/19/2022 1002    Satisfactory for evaluation; transformation zone component ABSENT.   ADEQPAP  11/14/2019 1029    Satisfactory for evaluation; transformation zone component ABSENT.   ADEQPAP  09/13/2017 0000    Satisfactory for evaluation  endocervical/transformation zone component PRESENT.    Review of Systems +urinary frequency +vaginal irritation Reviewed past medical,surgical, social and family history. Reviewed medications and allergies.     Objective:   Physical Exam BP 127/80 (BP Location: Left Arm, Patient Position: Sitting, Cuff Size: Normal)   Pulse (!) 109   Ht 5\' 5"  (1.651 m)   Wt 178 lb (80.7 kg)   LMP 12/18/2022 (Approximate)   BMI 29.62 kg/m  urine dipstick +blood,protein,leuks and ketones Skin warm and dry.Pelvic: external genitalia is normal in appearance no lesions, vagina: white discharge without odor,urethra has no lesions or masses noted, cervix:smooth and bulbous, uterus: normal size, shape and contour, non tender, no masses felt, adnexa: no masses or tenderness noted. Bladder is non tender and no masses felt. Right shin itches too.  Fall risk is low  Upstream - 01/07/23 1210       Pregnancy Intention Screening   Does the patient want to become pregnant in the next year? Ok Either Way    Does the patient's partner want to become  pregnant in the next year? Ok Either Way    Would the patient like to discuss contraceptive options today? No      Contraception Wrap Up   Current Method Abstinence;Female Condom    End Method Abstinence;Female Condom    Contraception Counseling Provided Yes               Examination chaperoned by Malachy Mood LPN  Assessment:     1. Urinary frequency +blood,protein and leuks Will send UA C&S  Will rx septra ds   - POCT Urinalysis Dipstick - Urine Culture - Urinalysis, Routine w reflex microscopic  2. Vaginal discharge - Cervicovaginal ancillary only( [place])  3. Vaginal irritation - Cervicovaginal ancillary only( [place])  4. Recurrent candidiasis of vagina Continue diflucan Will rx Terazol CV swab sent Meds ordered this encounter  Medications   sulfamethoxazole-trimethoprim (BACTRIM DS) 800-160 MG tablet    Sig: Take 1 tablet by mouth 2 (two) times daily. Take 1 bid    Dispense:  14 tablet    Refill:  0    Order Specific Question:   Supervising Provider    Answer:   Despina Hidden, LUTHER H [[id number]]   terconazole (TERAZOL 7) 0.4 % vaginal cream    Sig: Place 1 applicator vaginally at bedtime.    Dispense:  45 g    Refill:  0    Order Specific Question:   Supervising Provider    Answer:   Lazaro Arms [[id number]]    - Cervicovaginal ancillary  only( )     Plan:     Follow up in 2 weeks for recheck

## 2023-01-08 ENCOUNTER — Other Ambulatory Visit: Payer: Self-pay | Admitting: Adult Health

## 2023-01-11 ENCOUNTER — Other Ambulatory Visit: Payer: Self-pay | Admitting: Adult Health

## 2023-01-11 MED ORDER — FLUCONAZOLE 150 MG PO TABS: ORAL_TABLET | ORAL | 1 refills | Status: DC

## 2023-01-11 MED ORDER — METRONIDAZOLE 500 MG PO TABS: 500.0000 mg | ORAL_TABLET | Freq: Two times a day (BID) | ORAL | 0 refills | Status: DC

## 2023-01-21 ENCOUNTER — Encounter: Payer: Self-pay | Admitting: Adult Health

## 2023-01-21 ENCOUNTER — Other Ambulatory Visit (HOSPITAL_COMMUNITY): Admission: RE | Admit: 2023-01-21 | Payer: Medicaid Other | Source: Ambulatory Visit

## 2023-01-21 ENCOUNTER — Ambulatory Visit: Payer: Medicaid Other | Admitting: Adult Health

## 2023-01-21 VITALS — BP 116/73 | HR 79 | Ht 65.0 in | Wt 174.0 lb

## 2023-01-21 DIAGNOSIS — N898 Other specified noninflammatory disorders of vagina: Secondary | ICD-10-CM | POA: Insufficient documentation

## 2023-01-21 DIAGNOSIS — B3731 Acute candidiasis of vulva and vagina: Secondary | ICD-10-CM | POA: Insufficient documentation

## 2023-01-21 NOTE — Progress Notes (Signed)
  Subjective:     Patient ID: Cynthia Molina, female   DOB: [birth year], 42 y.o.   MRN: [id number]  HPI Cynthia Molina is a 42 year old black female, divorced, [id number], in complaining of vaginal irritation.      Component Value Date/Time   DIAGPAP  08/19/2022 1002    - Negative for intraepithelial lesion or malignancy (NILM)   DIAGPAP - Low grade squamous intraepithelial lesion (LSIL) (A) 11/14/2019 1029   DIAGPAP  09/13/2017 0000    NEGATIVE FOR INTRAEPITHELIAL LESIONS OR MALIGNANCY.   HPVHIGH Negative 08/19/2022 1002   HPVHIGH Positive (A) 11/14/2019 1029   ADEQPAP  08/19/2022 1002    Satisfactory for evaluation; transformation zone component ABSENT.   ADEQPAP  11/14/2019 1029    Satisfactory for evaluation; transformation zone component ABSENT.   ADEQPAP  09/13/2017 0000    Satisfactory for evaluation  endocervical/transformation zone component PRESENT.    Review of Systems +vaginal irritation Reviewed past medical,surgical, social and family history. Reviewed medications and allergies.     Objective:   Physical Exam BP 116/73 (BP Location: Left Arm, Patient Position: Sitting, Cuff Size: Normal)   Pulse 79   Ht 5\' 5"  (1.651 m)   Wt 174 lb (78.9 kg)   LMP 01/13/2023 (Approximate)   BMI 28.96 kg/m     Skin warm and dry.Pelvic: external genitalia is normal in appearance no lesions, vagina: has some clingy dischare on sidewalls, and redness, no odor, painted with gentian violet,urethra has no lesions or masses noted, cervix:smooth and bulbous, uterus: normal size, shape and contour, non tender, no masses felt, adnexa: no masses or tenderness noted. Bladder is non tender and no masses felt. CV swab obtained.  Examination chaperoned by Malachy Mood LPN   Upstream - 01/21/23 1128       Pregnancy Intention Screening   Does the patient want to become pregnant in the next year? Ok Either Way    Does the patient's partner want to become pregnant in the next year? Ok Either Way    Would  the patient like to discuss contraceptive options today? No      Contraception Wrap Up   Current Method No Method - Other Reason    End Method No Method - Other Reason             Assessment:     1. Vaginal irritation Painted with gentian violet CV swab sent for BV and yeast - Cervicovaginal ancillary only( [place])  2. Recurrent candidiasis of vagina Continue taking diflucan CV sent Painted with Gentian violet - Cervicovaginal ancillary only( [place])     Plan:     Follow up in 2 weeks

## 2023-01-22 LAB — CERVICOVAGINAL ANCILLARY ONLY
Bacterial Vaginitis (gardnerella): NEGATIVE
Candida Glabrata: NEGATIVE
Candida Vaginitis: POSITIVE — AB
Comment: NEGATIVE
Comment: NEGATIVE
Comment: NEGATIVE

## 2023-02-03 ENCOUNTER — Encounter: Payer: Self-pay | Admitting: Adult Health

## 2023-02-03 ENCOUNTER — Ambulatory Visit: Payer: Medicaid Other | Admitting: Adult Health

## 2023-02-03 VITALS — BP 109/65 | HR 65 | Ht 65.0 in | Wt 175.5 lb

## 2023-02-03 DIAGNOSIS — B3731 Acute candidiasis of vulva and vagina: Secondary | ICD-10-CM

## 2023-02-03 NOTE — Progress Notes (Signed)
  Subjective:     Patient ID: Cynthia Molina, female   DOB: 09-10-1980, 42 y.o.   MRN: [id number]  HPI Cynthia Molina is a 42 year old black female,divorced, [phone] in for follow up on recurrent yeast, she is using amphotericin 4% vaginal cream currently, started 01/26/23. She wonders if BV back.     Component Value Date/Time   DIAGPAP  08/19/2022 1002    - Negative for intraepithelial lesion or malignancy (NILM)   DIAGPAP - Low grade squamous intraepithelial lesion (LSIL) (A) 11/14/2019 1029   DIAGPAP  09/13/2017 0000    NEGATIVE FOR INTRAEPITHELIAL LESIONS OR MALIGNANCY.   HPVHIGH Negative 08/19/2022 1002   HPVHIGH Positive (A) 11/14/2019 1029   ADEQPAP  08/19/2022 1002    Satisfactory for evaluation; transformation zone component ABSENT.   ADEQPAP  11/14/2019 1029    Satisfactory for evaluation; transformation zone component ABSENT.   ADEQPAP  09/13/2017 0000    Satisfactory for evaluation  endocervical/transformation zone component PRESENT.     Review of Systems She wonders if BV back Reviewed past medical,surgical, social and family history. Reviewed medications and allergies.     Objective:   Physical Exam BP 109/65 (BP Location: Left Arm, Patient Position: Sitting, Cuff Size: Normal)   Pulse 65   Ht 5\' 5"  (1.651 m)   Wt 175 lb 8 oz (79.6 kg)   LMP 01/13/2023 (Approximate)   BMI 29.20 kg/m     Skin warm and dry.Pelvic: external genitalia is normal in appearance no lesions, vagina: has copious yellow cream in vaginal, no odor,urethra has no lesions or masses noted, cervix:smooth and bulbous, uterus: normal size, shape and contour, non tender, no masses felt, adnexa: no masses or tenderness noted. Bladder is non tender and no masses felt.  Upstream - 02/03/23 0935       Pregnancy Intention Screening   Does the patient want to become pregnant in the next year? Ok Either Way    Does the patient's partner want to become pregnant in the next year? Ok Either Way    Would the  patient like to discuss contraceptive options today? No      Contraception Wrap Up   Current Method No Method - Other Reason    End Method No Method - Other Reason    Contraception Counseling Provided No            Examination chaperoned by Marylu Lund young LPN Assessment:     1. Recurrent candidiasis of vagina Currently using amphotericin 4% cream in vagina     Plan:     Will recheck in 9 days

## 2023-02-12 ENCOUNTER — Encounter: Payer: Self-pay | Admitting: Adult Health

## 2023-02-12 ENCOUNTER — Other Ambulatory Visit (HOSPITAL_COMMUNITY)
Admission: RE | Admit: 2023-02-12 | Discharge: 2023-02-12 | Disposition: A | Discharge status: Home / Self Care | Payer: Medicaid Other | Source: Ambulatory Visit | Attending: Adult Health | Admitting: Adult Health

## 2023-02-12 ENCOUNTER — Ambulatory Visit: Payer: Medicaid Other | Admitting: Adult Health

## 2023-02-12 VITALS — BP 112/72 | HR 77 | Ht 65.0 in | Wt 177.5 lb

## 2023-02-12 DIAGNOSIS — B3731 Acute candidiasis of vulva and vagina: Secondary | ICD-10-CM

## 2023-02-12 NOTE — Progress Notes (Signed)
Subjective:     Patient ID: Cynthia Molina, female   DOB: January 08, 1981, 42 y.o.   MRN: [id number]  HPI Cynthia Molina is a 42 year old black female, divorced, [id number] in for recheck on recurrent yeast. Treated last with amphotericin cream.      Component Value Date/Time   DIAGPAP  08/19/2022 1002    - Negative for intraepithelial lesion or malignancy (NILM)   DIAGPAP - Low grade squamous intraepithelial lesion (LSIL) (A) 11/14/2019 1029   DIAGPAP  09/13/2017 0000    NEGATIVE FOR INTRAEPITHELIAL LESIONS OR MALIGNANCY.   HPVHIGH Negative 08/19/2022 1002   HPVHIGH Positive (A) 11/14/2019 1029   ADEQPAP  08/19/2022 1002    Satisfactory for evaluation; transformation zone component ABSENT.   ADEQPAP  11/14/2019 1029    Satisfactory for evaluation; transformation zone component ABSENT.   ADEQPAP  09/13/2017 0000    Satisfactory for evaluation  endocervical/transformation zone component PRESENT.   Review of Systems No discharge or itching Period seems to be closer together with clotting Reviewed past medical,surgical, social and family history. Reviewed medications and allergies.     Objective:   Physical Exam BP 112/72 (BP Location: Left Arm, Patient Position: Sitting, Cuff Size: Normal)   Pulse 77   Ht 5\' 5"  (1.651 m)   Wt 177 lb 8 oz (80.5 kg)   LMP 02/05/2023 (Approximate)   BMI 29.54 kg/m     Skin warm and dry.Pelvic: external genitalia is normal in appearance no lesions, vagina: +dark blood, no discharge,urethra has no lesions or masses noted, cervix:smooth and bulbous, uterus: normal size, shape and contour, non tender, no masses felt, adnexa: no masses or tenderness noted. Bladder is non tender and no masses felt. CV swab obtained  Upstream - 02/12/23 0935       Pregnancy Intention Screening   Does the patient want to become pregnant in the next year? Ok Either Way    Does the patient's partner want to become pregnant in the next year? Ok Either Way    Would the patient like to  discuss contraceptive options today? No      Contraception Wrap Up   Current Method Abstinence;Female Condom    End Method Female Condom    Contraception Counseling Provided No            Examination chaperoned by Malachy Mood LPN  Assessment:     1. Recurrent candidiasis of vagina Vaginal has no yeasty discharge, has finished amphotericin cream  CV swab sent for yeast and BV - Cervicovaginal ancillary only( [place])     Plan:     Follow up prn

## 2023-02-15 ENCOUNTER — Other Ambulatory Visit: Payer: Self-pay | Admitting: Adult Health

## 2023-02-15 LAB — CERVICOVAGINAL ANCILLARY ONLY
Bacterial Vaginitis (gardnerella): POSITIVE — AB
Candida Glabrata: NEGATIVE
Candida Vaginitis: POSITIVE — AB
Comment: NEGATIVE
Comment: NEGATIVE
Comment: NEGATIVE

## 2023-02-15 MED ORDER — METRONIDAZOLE 0.75 % VA GEL: 1.0000 | Freq: Every day | VAGINAL | 0 refills | Status: DC

## 2023-02-15 MED ORDER — FLUCONAZOLE 150 MG PO TABS: ORAL_TABLET | ORAL | 1 refills | Status: DC

## 2023-02-15 NOTE — Progress Notes (Signed)
+  BV and yeast on swab will rx metrogel and diflucan

## 2023-02-22 ENCOUNTER — Other Ambulatory Visit: Payer: Medicaid Other

## 2023-02-22 ENCOUNTER — Other Ambulatory Visit: Payer: Self-pay | Admitting: Adult Health

## 2023-02-22 DIAGNOSIS — R350 Frequency of micturition: Secondary | ICD-10-CM

## 2023-02-22 DIAGNOSIS — R300 Dysuria: Secondary | ICD-10-CM | POA: Diagnosis not present

## 2023-02-22 LAB — POCT URINALYSIS DIPSTICK OB
Ketones, UA: NEGATIVE
Nitrite, UA: POSITIVE

## 2023-02-22 MED ORDER — SULFAMETHOXAZOLE-TRIMETHOPRIM 800-160 MG PO TABS: 1.0000 | ORAL_TABLET | Freq: Two times a day (BID) | ORAL | 0 refills | Status: DC

## 2023-02-22 NOTE — Progress Notes (Signed)
Rx septra ds

## 2023-02-22 NOTE — Progress Notes (Signed)
   NURSE VISIT- UTI SYMPTOMS   SUBJECTIVE:  Cynthia Molina is a 42 y.o. [phone] female here for UTI symptoms. She is a GYN patient. She reports dysuria and urinary frequency. Started taking AZO yesterday.  OBJECTIVE:  LMP 02/05/2023 (Approximate)   Appears well, in no apparent distress  Results for orders placed or performed in visit on 02/22/23 (from the past 24 hour(s))  POC Urinalysis Dipstick OB   Collection Time: 02/22/23 11:01 AM  Result Value Ref Range   Color, UA     Clarity, UA     Glucose, UA Trace (A) Negative   Bilirubin, UA     Ketones, UA neg    Spec Grav, UA     Blood, UA trace    pH, UA     POC,PROTEIN,UA Trace Negative, Trace, Small (1+), Moderate (2+), Large (3+), 4+   Urobilinogen, UA     Nitrite, UA positive    Leukocytes, UA Large (3+) (A) Negative   Appearance     Odor      ASSESSMENT: GYN patient with UTI symptoms and positive nitrites  PLAN: Note routed to Cyril Mourning, AGNP   Rx sent by provider today: Yes, pt requesting Urine culture sent Call or return to clinic prn if these symptoms worsen or fail to improve as anticipated. Follow-up: as needed   Jobe Marker  02/22/2023 11:10 AM

## 2023-02-23 ENCOUNTER — Ambulatory Visit: Payer: Medicaid Other | Admitting: Adult Health

## 2023-02-23 ENCOUNTER — Encounter: Payer: Self-pay | Admitting: Adult Health

## 2023-02-23 ENCOUNTER — Other Ambulatory Visit (HOSPITAL_COMMUNITY)
Admission: RE | Admit: 2023-02-23 | Discharge: 2023-02-23 | Disposition: A | Discharge status: Home / Self Care | Payer: Medicaid Other | Source: Ambulatory Visit | Attending: Adult Health | Admitting: Adult Health

## 2023-02-23 VITALS — BP 113/66 | HR 67 | Ht 65.0 in | Wt 177.5 lb

## 2023-02-23 DIAGNOSIS — B3731 Acute candidiasis of vulva and vagina: Secondary | ICD-10-CM | POA: Insufficient documentation

## 2023-02-23 DIAGNOSIS — N898 Other specified noninflammatory disorders of vagina: Secondary | ICD-10-CM

## 2023-02-23 LAB — URINALYSIS, ROUTINE W REFLEX MICROSCOPIC
Bilirubin, UA: NEGATIVE
Glucose, UA: NEGATIVE
Ketones, UA: NEGATIVE
Nitrite, UA: POSITIVE — AB
RBC, UA: NEGATIVE
Specific Gravity, UA: 1.017 (ref 1.005–1.030)
Urobilinogen, Ur: 1 mg/dL (ref 0.2–1.0)
pH, UA: 7 (ref 5.0–7.5)

## 2023-02-23 LAB — MICROSCOPIC EXAMINATION
Casts: NONE SEEN /lpf
Epithelial Cells (non renal): >10 /hpf — AB (ref 0–10)
RBC, Urine: NONE SEEN /hpf (ref 0–2)

## 2023-02-23 NOTE — Progress Notes (Signed)
Subjective:     Patient ID: Cynthia Molina, female   DOB: 01/31/1981, 42 y.o.   MRN: [id number]  HPI Cynthia Molina is a 42 year old black female,divorced, [id number], in complaining of discharge, mild itching at times. She is on septra ds for UTI.    Component Value Date/Time   DIAGPAP  08/19/2022 1002    - Negative for intraepithelial lesion or malignancy (NILM)   DIAGPAP - Low grade squamous intraepithelial lesion (LSIL) (A) 11/14/2019 1029   DIAGPAP  09/13/2017 0000    NEGATIVE FOR INTRAEPITHELIAL LESIONS OR MALIGNANCY.   HPVHIGH Negative 08/19/2022 1002   HPVHIGH Positive (A) 11/14/2019 1029   ADEQPAP  08/19/2022 1002    Satisfactory for evaluation; transformation zone component ABSENT.   ADEQPAP  11/14/2019 1029    Satisfactory for evaluation; transformation zone component ABSENT.   ADEQPAP  09/13/2017 0000    Satisfactory for evaluation  endocervical/transformation zone component PRESENT.    Review of Systems +vaginal discharge, mild itching Reviewed past medical,surgical, social and family history. Reviewed medications and allergies.     Objective:   Physical Exam BP 113/66 (BP Location: Left Arm, Patient Position: Sitting, Cuff Size: Normal)   Pulse 67   Ht 5\' 5"  (1.651 m)   Wt 177 lb 8 oz (80.5 kg)   LMP 02/05/2023 (Approximate)   BMI 29.54 kg/m     Skin warm and dry.Pelvic: external genitalia is normal in appearance no lesions, vagina: scant white discharge without odor,urethra has no lesions or masses noted, cervix:smooth and bulbous, uterus: normal size, shape and contour, non tender, no masses felt, adnexa: no masses or tenderness noted. Bladder is non tender and no masses felt. CV Swab obtained.  Upstream - 02/23/23 1126       Pregnancy Intention Screening   Does the patient want to become pregnant in the next year? Ok Either Way    Does the patient's partner want to become pregnant in the next year? Ok Either Way    Would the patient like to discuss contraceptive  options today? No      Contraception Wrap Up   Current Method Female Condom    End Method Female Condom    Contraception Counseling Provided No            Examination chaperoned by Malachy Mood LPN  Assessment:     1. Vaginal discharge Scant discharge, no odor CV swab sent for BV and yeast  - Cervicovaginal ancillary only( [place])  2. Recurrent candidiasis of vagina CV swab sent for BV and yeast  - Cervicovaginal ancillary only( [place])    Plan:     Follow up prn

## 2023-02-24 ENCOUNTER — Other Ambulatory Visit: Payer: Self-pay | Admitting: Adult Health

## 2023-02-24 LAB — CERVICOVAGINAL ANCILLARY ONLY
Bacterial Vaginitis (gardnerella): POSITIVE — AB
Candida Glabrata: NEGATIVE
Candida Vaginitis: POSITIVE — AB
Comment: NEGATIVE
Comment: NEGATIVE
Comment: NEGATIVE

## 2023-02-24 LAB — URINE CULTURE

## 2023-02-24 MED ORDER — TERCONAZOLE 0.4 % VA CREA: 1.0000 | TOPICAL_CREAM | Freq: Every day | VAGINAL | 1 refills | Status: DC

## 2023-02-24 MED ORDER — AMPICILLIN 500 MG PO CAPS: 500.0000 mg | ORAL_CAPSULE | Freq: Three times a day (TID) | ORAL | 0 refills | Status: DC

## 2023-02-24 MED ORDER — METRONIDAZOLE 500 MG PO TABS: 500.0000 mg | ORAL_TABLET | Freq: Two times a day (BID) | ORAL | 0 refills | Status: DC

## 2023-02-24 NOTE — Progress Notes (Signed)
+  BV and yeast on vaginal swab will rx terazol and flagyl, no sex or alcohol while taking

## 2023-03-01 ENCOUNTER — Other Ambulatory Visit: Payer: Self-pay | Admitting: Adult Health

## 2023-03-01 MED ORDER — AMPICILLIN 500 MG PO CAPS: 500.0000 mg | ORAL_CAPSULE | Freq: Three times a day (TID) | ORAL | 0 refills | Status: DC

## 2023-03-01 NOTE — Progress Notes (Signed)
Will reorder ampicillin, has missed placed

## 2023-03-02 ENCOUNTER — Encounter: Payer: Self-pay | Admitting: Adult Health

## 2023-03-02 ENCOUNTER — Other Ambulatory Visit (HOSPITAL_COMMUNITY)
Admission: RE | Admit: 2023-03-02 | Discharge: 2023-03-02 | Disposition: A | Discharge status: Home / Self Care | Payer: Medicaid Other | Source: Ambulatory Visit | Attending: Adult Health | Admitting: Adult Health

## 2023-03-02 ENCOUNTER — Ambulatory Visit: Payer: Medicaid Other | Admitting: Adult Health

## 2023-03-02 VITALS — BP 116/68 | HR 77 | Ht 65.0 in | Wt 178.0 lb

## 2023-03-02 DIAGNOSIS — B3731 Acute candidiasis of vulva and vagina: Secondary | ICD-10-CM | POA: Insufficient documentation

## 2023-03-02 DIAGNOSIS — R350 Frequency of micturition: Secondary | ICD-10-CM

## 2023-03-02 LAB — POCT URINALYSIS DIPSTICK OB
Blood, UA: NEGATIVE
Glucose, UA: NEGATIVE
Ketones, UA: NEGATIVE
Nitrite, UA: NEGATIVE
POC,PROTEIN,UA: NEGATIVE
Spec Grav, UA: 1.01 (ref 1.010–1.025)

## 2023-03-02 MED ORDER — FLUCONAZOLE 150 MG PO TABS: ORAL_TABLET | ORAL | 1 refills | Status: DC

## 2023-03-02 NOTE — Progress Notes (Signed)
  Subjective:     Patient ID: Cynthia Molina, female   DOB: Jun 03, 1980, 42 y.o.   MRN: [id number]  HPI Cynthia Molina is a 42 year old black female,divorced, [id number] in requesting urine check and CV swab for yeast and BV.     Component Value Date/Time   DIAGPAP  08/19/2022 1002    - Negative for intraepithelial lesion or malignancy (NILM)   DIAGPAP - Low grade squamous intraepithelial lesion (LSIL) (A) 11/14/2019 1029   DIAGPAP  09/13/2017 0000    NEGATIVE FOR INTRAEPITHELIAL LESIONS OR MALIGNANCY.   HPVHIGH Negative 08/19/2022 1002   HPVHIGH Positive (A) 11/14/2019 1029   ADEQPAP  08/19/2022 1002    Satisfactory for evaluation; transformation zone component ABSENT.   ADEQPAP  11/14/2019 1029    Satisfactory for evaluation; transformation zone component ABSENT.   ADEQPAP  09/13/2017 0000    Satisfactory for evaluation  endocervical/transformation zone component PRESENT.    Review of Systems Denies any itching or burning today Urinary frequency  Reviewed past medical,surgical, social and family history. Reviewed medications and allergies.     Objective:   Physical Exam BP 116/68 (BP Location: Left Arm, Patient Position: Sitting, Cuff Size: Large)   Pulse 77   Wt 178 lb (80.7 kg)   LMP 02/05/2023 (Approximate)   BMI 29.62 kg/m    urine dipstick trace leuks   Skin warm and dry.Pelvic: external genitalia is normal in appearance no lesions, vagina: +metrogel,urethra has no lesions or masses noted, cervix:smooth and bulbous, uterus: normal size, shape and contour, non tender, no masses felt, adnexa: no masses or tenderness noted. Bladder is non tender and no masses felt. CV swab obtained. Fall risk is low  Upstream - 03/02/23 1454       Pregnancy Intention Screening   Does the patient want to become pregnant in the next year? Ok Either Way    Would the patient like to discuss contraceptive options today? No      Contraception Wrap Up   Current Method Female Condom    End Method Female  Condom    Contraception Counseling Provided No            Examination chaperoned by Freddie Apley RN   Assessment:     1. Recurrent candidiasis of vagina CV swab sent for BV and yeast  Continue taking diflucan every every 3 days for now Meds ordered this encounter  Medications   fluconazole (DIFLUCAN) 150 MG tablet    Sig: Take 1 now and 1 in 3 day and another 3 days after that    Dispense:  5 tablet    Refill:  1    Order Specific Question:   Supervising Provider    Answer:   Duane Lope H [[id number]]    - Cervicovaginal ancillary only( [place])  2. Urinary frequency Trace leuks - POC Urinalysis Dipstick OB     Plan:     Follow up prn

## 2023-03-08 LAB — CERVICOVAGINAL ANCILLARY ONLY
Bacterial Vaginitis (gardnerella): POSITIVE — AB
Candida Glabrata: NEGATIVE
Candida Vaginitis: POSITIVE — AB
Comment: NEGATIVE
Comment: NEGATIVE
Comment: NEGATIVE

## 2023-03-09 ENCOUNTER — Other Ambulatory Visit: Payer: Self-pay | Admitting: Adult Health

## 2023-03-09 MED ORDER — METRONIDAZOLE 500 MG PO TABS: 500.0000 mg | ORAL_TABLET | Freq: Two times a day (BID) | ORAL | 0 refills | Status: DC

## 2023-03-09 MED ORDER — TERCONAZOLE 0.4 % VA CREA: 1.0000 | TOPICAL_CREAM | Freq: Every day | VAGINAL | 1 refills | Status: DC

## 2023-03-18 ENCOUNTER — Ambulatory Visit: Payer: Medicaid Other | Admitting: Adult Health

## 2023-03-18 ENCOUNTER — Encounter: Payer: Self-pay | Admitting: Adult Health

## 2023-03-18 ENCOUNTER — Other Ambulatory Visit (HOSPITAL_COMMUNITY)
Admission: RE | Admit: 2023-03-18 | Discharge: 2023-03-18 | Disposition: A | Discharge status: Home / Self Care | Payer: Medicaid Other | Source: Ambulatory Visit | Attending: Adult Health | Admitting: Adult Health

## 2023-03-18 VITALS — BP 112/72 | HR 71 | Ht 65.0 in | Wt 178.0 lb

## 2023-03-18 DIAGNOSIS — B3731 Acute candidiasis of vulva and vagina: Secondary | ICD-10-CM | POA: Diagnosis not present

## 2023-03-18 DIAGNOSIS — N898 Other specified noninflammatory disorders of vagina: Secondary | ICD-10-CM | POA: Insufficient documentation

## 2023-03-18 NOTE — Progress Notes (Signed)
  Subjective:     Patient ID: Cynthia Molina, female   DOB: 1981/03/20, 42 y.o.   MRN: [id number]  HPI Cynthia Molina is a 42 year old black female,divorced, [id number] back in follow up of recurrent yeast and recent BV. Feels good today, no itching or burning.     Component Value Date/Time   DIAGPAP  08/19/2022 1002    - Negative for intraepithelial lesion or malignancy (NILM)   DIAGPAP - Low grade squamous intraepithelial lesion (LSIL) (A) 11/14/2019 1029   DIAGPAP  09/13/2017 0000    NEGATIVE FOR INTRAEPITHELIAL LESIONS OR MALIGNANCY.   HPVHIGH Negative 08/19/2022 1002   HPVHIGH Positive (A) 11/14/2019 1029   ADEQPAP  08/19/2022 1002    Satisfactory for evaluation; transformation zone component ABSENT.   ADEQPAP  11/14/2019 1029    Satisfactory for evaluation; transformation zone component ABSENT.   ADEQPAP  09/13/2017 0000    Satisfactory for evaluation  endocervical/transformation zone component PRESENT.   Review of Systems No itching or burning now Reviewed past medical,surgical, social and family history. Reviewed medications and allergies.     Objective:   Physical Exam BP 112/72 (BP Location: Left Arm, Patient Position: Sitting, Cuff Size: Normal)   Pulse 71   Ht 5\' 5"  (1.651 m)   Wt 178 lb (80.7 kg)   LMP 03/03/2023 (Approximate)   BMI 29.62 kg/m     Skin warm and dry.Pelvic: external genitalia is normal in appearance no lesions, vagina: scant white discharge without odor,urethra has no lesions or masses noted, cervix:smooth and bulbous, uterus: normal size, shape and contour, non tender, no masses felt, adnexa: no masses or tenderness noted. Bladder is non tender and no masses felt. CV swab obtained   Upstream - 03/18/23 1111       Pregnancy Intention Screening   Does the patient want to become pregnant in the next year? Ok Either Way    Does the patient's partner want to become pregnant in the next year? Ok Either Way    Would the patient like to discuss contraceptive  options today? No      Contraception Wrap Up   Current Method Abstinence    End Method Female Condom            Examination chaperoned by [place] Scear[employer] NP student   Assessment:     1. Recurrent candidiasis of vagina No itching or burning CV swab sent  - Cervicovaginal ancillary only( [place])  2. Vaginal discharge Scant white discharge, no odor CV swab for BV and yeast  - Cervicovaginal ancillary only( [place])     Plan:     Follow up prn

## 2023-03-19 LAB — CERVICOVAGINAL ANCILLARY ONLY
Bacterial Vaginitis (gardnerella): POSITIVE — AB
Candida Glabrata: NEGATIVE
Candida Vaginitis: POSITIVE — AB
Comment: NEGATIVE
Comment: NEGATIVE
Comment: NEGATIVE

## 2023-03-22 ENCOUNTER — Other Ambulatory Visit: Payer: Self-pay | Admitting: Adult Health

## 2023-03-22 MED ORDER — TERCONAZOLE 0.4 % VA CREA: 1.0000 | TOPICAL_CREAM | Freq: Every day | VAGINAL | 1 refills | Status: DC

## 2023-03-22 MED ORDER — METRONIDAZOLE 500 MG PO TABS: 500.0000 mg | ORAL_TABLET | Freq: Two times a day (BID) | ORAL | 0 refills | Status: DC

## 2023-03-30 ENCOUNTER — Ambulatory Visit: Payer: Medicaid Other | Admitting: Adult Health

## 2023-03-30 ENCOUNTER — Other Ambulatory Visit (HOSPITAL_COMMUNITY)
Admission: RE | Admit: 2023-03-30 | Discharge: 2023-03-30 | Disposition: A | Discharge status: Home / Self Care | Payer: Medicaid Other | Source: Ambulatory Visit | Attending: Adult Health | Admitting: Adult Health

## 2023-03-30 ENCOUNTER — Encounter: Payer: Self-pay | Admitting: Adult Health

## 2023-03-30 VITALS — BP 116/78 | HR 61 | Ht 65.0 in | Wt 178.0 lb

## 2023-03-30 DIAGNOSIS — N898 Other specified noninflammatory disorders of vagina: Secondary | ICD-10-CM | POA: Diagnosis not present

## 2023-03-30 DIAGNOSIS — B3731 Acute candidiasis of vulva and vagina: Secondary | ICD-10-CM | POA: Insufficient documentation

## 2023-03-30 NOTE — Progress Notes (Signed)
  Subjective:     Patient ID: Cynthia Molina, female   DOB: May 30, 1981, 42 y.o.   MRN: [id number]  HPI Cynthia Molina is a 42 year old black  female,divorced, [id number], in with vaginal discharge. Has had recurrent yeast and BV.     Component Value Date/Time   DIAGPAP  08/19/2022 1002    - Negative for intraepithelial lesion or malignancy (NILM)   DIAGPAP - Low grade squamous intraepithelial lesion (LSIL) (A) 11/14/2019 1029   DIAGPAP  09/13/2017 0000    NEGATIVE FOR INTRAEPITHELIAL LESIONS OR MALIGNANCY.   HPVHIGH Negative 08/19/2022 1002   HPVHIGH Positive (A) 11/14/2019 1029   ADEQPAP  08/19/2022 1002    Satisfactory for evaluation; transformation zone component ABSENT.   ADEQPAP  11/14/2019 1029    Satisfactory for evaluation; transformation zone component ABSENT.   ADEQPAP  09/13/2017 0000    Satisfactory for evaluation  endocervical/transformation zone component PRESENT.    Review of Systems +vaginal discharge No itching or burning today Reviewed past medical,surgical, social and family history. Reviewed medications and allergies.     Objective:   Physical Exam BP 116/78 (BP Location: Left Arm, Patient Position: Sitting, Cuff Size: Normal)   Pulse 61   Ht 5\' 5"  (1.651 m)   Wt 178 lb (80.7 kg)   LMP 03/07/2023 (Approximate)   BMI 29.62 kg/m     Skin warm and dry.Pelvic: external genitalia is normal in appearance no lesions, vagina: white discharge without odor,urethra has no lesions or masses noted, cervix:smooth and bulbous, uterus: normal size, shape and contour, non tender, no masses felt, adnexa: no masses or tenderness noted. Bladder is non tender and no masses felt. CV swab obtained  Upstream - 03/30/23 1026       Pregnancy Intention Screening   Does the patient want to become pregnant in the next year? Ok Either Way    Does the patient's partner want to become pregnant in the next year? Ok Either Way    Would the patient like to discuss contraceptive options today? No       Contraception Wrap Up   Current Method No Method - Other Reason    Reason for No Current Contraceptive Method at Intake ([facility] Only) Other    End Method No Method - Other Reason    Contraception Counseling Provided No            Examination chaperoned by Malachy Mood LPN   Assessment:     1. Vaginal discharge CV swab sent for BV and yeast  - Cervicovaginal ancillary only( [place])  2. Recurrent candidiasis of vagina CV swab sent  - Cervicovaginal ancillary only( [place])     Plan:     Follow up prn

## 2023-03-31 ENCOUNTER — Telehealth: Payer: Self-pay | Admitting: *Deleted

## 2023-03-31 ENCOUNTER — Other Ambulatory Visit: Payer: Self-pay | Admitting: Adult Health

## 2023-03-31 DIAGNOSIS — B3731 Acute candidiasis of vulva and vagina: Secondary | ICD-10-CM | POA: Diagnosis not present

## 2023-03-31 LAB — CERVICOVAGINAL ANCILLARY ONLY
Bacterial Vaginitis (gardnerella): POSITIVE — AB
Candida Glabrata: NEGATIVE
Candida Vaginitis: POSITIVE — AB
Comment: NEGATIVE
Comment: NEGATIVE
Comment: NEGATIVE

## 2023-03-31 MED ORDER — FLUCONAZOLE 100 MG PO TABS: 100.0000 mg | ORAL_TABLET | Freq: Every day | ORAL | 0 refills | Status: AC

## 2023-03-31 MED ORDER — BORIC ACID VAGINAL 600 MG VA SUPP: 1.0000 | Freq: Every day | VAGINAL | 1 refills | Status: AC

## 2023-03-31 NOTE — Progress Notes (Signed)
Will rx diflucan 100 mg 1 daily for 14 days  and boric acid supp 1 in vagina at bedtime for 14 days

## 2023-03-31 NOTE — Telephone Encounter (Signed)
[facility] in [place] is unable to get Boric Acid supp. They don't have the OTC Boric Acid available either. [facility] has the OTC available. Pt advised to do supp BID X 14 days. OTC is 300 mg. Pt voiced understanding. JSY

## 2023-04-15 ENCOUNTER — Ambulatory Visit: Payer: Medicaid Other | Admitting: Adult Health

## 2023-04-15 ENCOUNTER — Encounter: Payer: Self-pay | Admitting: Adult Health

## 2023-04-15 ENCOUNTER — Other Ambulatory Visit (HOSPITAL_COMMUNITY)
Admission: RE | Admit: 2023-04-15 | Discharge: 2023-04-15 | Disposition: A | Discharge status: Home / Self Care | Payer: Medicaid Other | Source: Ambulatory Visit | Attending: Adult Health | Admitting: Adult Health

## 2023-04-15 VITALS — BP 108/69 | HR 61 | Ht 65.0 in | Wt 180.5 lb

## 2023-04-15 DIAGNOSIS — N898 Other specified noninflammatory disorders of vagina: Secondary | ICD-10-CM | POA: Diagnosis not present

## 2023-04-15 DIAGNOSIS — B3731 Acute candidiasis of vulva and vagina: Secondary | ICD-10-CM

## 2023-04-15 NOTE — Progress Notes (Signed)
  Subjective:     Patient ID: Cynthia Molina, female   DOB: 24-Apr-1981, 42 y.o.   MRN: [id number]  HPI [name] is a 42 year old black female, divorced, [id number], back in follow up on having BV and recurrent yeast, has some itching. She has ?rash left inner thigh. She has not finished diflucan yet, and used last boric acid supp last night.      Component Value Date/Time   DIAGPAP  08/19/2022 1002    - Negative for intraepithelial lesion or malignancy (NILM)   DIAGPAP - Low grade squamous intraepithelial lesion (LSIL) (A) 11/14/2019 1029   DIAGPAP  09/13/2017 0000    NEGATIVE FOR INTRAEPITHELIAL LESIONS OR MALIGNANCY.   HPVHIGH Negative 08/19/2022 1002   HPVHIGH Positive (A) 11/14/2019 1029   ADEQPAP  08/19/2022 1002    Satisfactory for evaluation; transformation zone component ABSENT.   ADEQPAP  11/14/2019 1029    Satisfactory for evaluation; transformation zone component ABSENT.   ADEQPAP  09/13/2017 0000    Satisfactory for evaluation  endocervical/transformation zone component PRESENT.    Review of Systems Has some vaginal itching ?rash left inner thigh Not having sex   Reviewed past medical,surgical, social and family history. Reviewed medications and allergies.  Objective:   Physical Exam BP 108/69 (BP Location: Left Arm, Patient Position: Sitting, Cuff Size: Normal)   Pulse 61   Ht 5\' 5"  (1.651 m)   Wt 180 lb 8 oz (81.9 kg)   LMP 03/30/2023 (Exact Date)   BMI 30.04 kg/m     Skin warm and dry.Pelvic: external genitalia is normal in appearance no lesions, vagina: scant discharge without odor,urethra has no lesions or masses noted, cervix:smooth and bulbous, uterus: normal size, shape and contour, non tender, no masses felt, adnexa: no masses or tenderness noted. Bladder is non tender and no masses felt.CV swab obtained. Has few bumps left inner thigh, no redness noted. Fall risk is low  Upstream - 04/15/23 1011       Pregnancy Intention Screening   Does the patient want  to become pregnant in the next year? Ok Either Way    Would the patient like to discuss contraceptive options today? No      Contraception Wrap Up   Current Method Female Condom    End Method Female Condom    Contraception Counseling Provided No            Examination chaperoned by Freddie Apley RN   Assessment:     1. Vaginal itching Has some itching, scan discharge, no odor CV swab sent for BV and yeast  - Cervicovaginal ancillary only( [place])  2. Recurrent candidiasis of vagina     Plan:     Follow up prn

## 2023-04-16 LAB — CERVICOVAGINAL ANCILLARY ONLY
Bacterial Vaginitis (gardnerella): NEGATIVE
Candida Glabrata: NEGATIVE
Candida Vaginitis: NEGATIVE
Comment: NEGATIVE
Comment: NEGATIVE
Comment: NEGATIVE

## 2023-07-23 ENCOUNTER — Ambulatory Visit: Payer: Medicaid Other | Admitting: Family Medicine

## 2023-07-23 ENCOUNTER — Encounter: Payer: Self-pay | Admitting: Family Medicine

## 2023-07-23 VITALS — BP 117/75 | HR 86 | Resp 16 | Ht 64.0 in | Wt 184.0 lb

## 2023-07-23 DIAGNOSIS — E038 Other specified hypothyroidism: Secondary | ICD-10-CM | POA: Diagnosis not present

## 2023-07-23 DIAGNOSIS — Z114 Encounter for screening for human immunodeficiency virus [HIV]: Secondary | ICD-10-CM

## 2023-07-23 DIAGNOSIS — R7301 Impaired fasting glucose: Secondary | ICD-10-CM | POA: Diagnosis not present

## 2023-07-23 DIAGNOSIS — Z1159 Encounter for screening for other viral diseases: Secondary | ICD-10-CM | POA: Diagnosis not present

## 2023-07-23 DIAGNOSIS — L219 Seborrheic dermatitis, unspecified: Secondary | ICD-10-CM

## 2023-07-23 DIAGNOSIS — E7849 Other hyperlipidemia: Secondary | ICD-10-CM

## 2023-07-23 DIAGNOSIS — E559 Vitamin D deficiency, unspecified: Secondary | ICD-10-CM

## 2023-07-23 DIAGNOSIS — Z1231 Encounter for screening mammogram for malignant neoplasm of breast: Secondary | ICD-10-CM

## 2023-07-23 MED ORDER — KETOCONAZOLE 2 % EX SHAM: MEDICATED_SHAMPOO | CUTANEOUS | 0 refills | Status: DC

## 2023-07-23 MED ORDER — TRIAMCINOLONE ACETONIDE 0.1 % EX CREA: 1.0000 | TOPICAL_CREAM | Freq: Every day | CUTANEOUS | 0 refills | Status: AC

## 2023-07-23 MED ORDER — KETOCONAZOLE 2 % EX CREA: 1.0000 | TOPICAL_CREAM | Freq: Every day | CUTANEOUS | 0 refills | Status: DC

## 2023-07-23 MED ORDER — BETAMETHASONE DIPROPIONATE 0.05 % EX LOTN: TOPICAL_LOTION | CUTANEOUS | 0 refills | Status: DC

## 2023-07-23 NOTE — Assessment & Plan Note (Signed)
 The patient has been prescribed Ketoconazole 2% shampoo for scalp application, using 5 to 10 mL, which should be left on for three to five minutes before rinsing. It is to be used three times per week for four weeks, then reduced to once weekly to prevent relapse. Minor side effects, such as irritation or a burning sensation, may occur. Additionally, Betamethasone dipropionate 0.05% lotion has been prescribed for the scalp, to be applied once daily for two weeks, followed by twice weekly for maintenance. For the rash on the buttocks, the patient has been prescribed Kenalog 0.1% cream, which should be applied once daily until symptoms subside. A dermatology referral has also been placed for further evaluation and management.

## 2023-07-23 NOTE — Progress Notes (Signed)
 New Patient Office Visit  Subjective:  Patient ID: Cynthia Molina, female    DOB: [birth year]  Age: 43 y.o. MRN: [id number]  CC:  Chief Complaint  Patient presents with   Establish Care   Rash    Has a rash that will appear on her neck and hairline and she doesn't know what's causing it. Also an area on her lower back she wants checked     HPI Cynthia Molina is a 43 y.o. female with past medical history of gradual pain, urinary frequency,  and fibroid presents for establishing care. For the details of today's visit, please refer to the assessment and plan.    Rash: The patient presents with a chronic history of pruritic, white patches and flaking affecting the scalp, consistent with seborrheic dermatitis. The rash on the back of the neck and intergluteal cleft appears as round, scaly lesions, with the intergluteal area showing ill-defined round scales. The patient describes the affected areas as itchy and occasionally irritated, particularly with sweating and humidity, which exacerbate symptoms. There is no associated pain, oozing, or signs of secondary infection. Symptoms have been persistent but intermittently flaring over time.   Past Medical History:  Diagnosis Date   Abnormal Papanicolaou smear of cervix with positive human papilloma virus (HPV) test 11/21/2019   LSIL will get colpo____________   Allergy    Anemia 11/15/2019   Anxiety    Bacterial vaginosis    Depression    Fibroid 09/29/2017   Heart murmur    HPV (human papilloma virus) infection    Hx of trichomoniasis    Miscarriage    Thickened endometrium 09/29/2017   Will get bx    Past Surgical History:  Procedure Laterality Date   CESAREAN SECTION     elective abortion      Family History  Problem Relation Age of Onset   Other Father        collapsed lung; breathing issues   Asthma Daughter    Anxiety disorder Daughter    Depression Daughter     Social History   Socioeconomic History   Marital  status: Divorced    Spouse name: Not on file   Number of children: 1   Years of education: Not on file   Highest education level: 10th grade  Occupational History   Not on file  Tobacco Use   Smoking status: Never   Smokeless tobacco: Never  Vaping Use   Vaping status: Never Used  Substance and Sexual Activity   Alcohol use: No   Drug use: No   Sexual activity: Yes    Birth control/protection: Condom, None  Other Topics Concern   Not on file  Social History Narrative   Not on file   Social Drivers of Health   Financial Resource Strain: Patient Declined (07/21/2023)   Overall Financial Resource Strain (CARDIA)    Difficulty of Paying Living Expenses: Patient declined  Food Insecurity: No Food Insecurity (07/21/2023)   Hunger Vital Sign    Worried About Running Out of Food in the Last Year: Never true    Ran Out of Food in the Last Year: Never true  Transportation Needs: No Transportation Needs (07/21/2023)   PRAPARE - Administrator, Civil Service (Medical): No    Lack of Transportation (Non-Medical): No  Physical Activity: Insufficiently Active (07/21/2023)   Exercise Vital Sign    Days of Exercise per Week: 5 days    Minutes of Exercise per Session: 20  min  Stress: No Stress Concern Present (07/21/2023)   [employer] of Occupational Health - Occupational Stress Questionnaire    Feeling of Stress : Not at all  Social Connections: Unknown (07/21/2023)   Social Connection and Isolation Panel [NHANES]    Frequency of Communication with Friends and Family: Twice a week    Frequency of Social Gatherings with Friends and Family: Patient declined    Attends Religious Services: 1 to 4 times per year    Active Member of [employer] or Organizations: Not on file    Attends Banker Meetings: Not on file    Marital Status: Divorced  Intimate Partner Violence: Not At Risk (03/31/2023)   Received from [facility]   Humiliation, Afraid, Rape, and Kick  questionnaire    Fear of Current or Ex-Partner: No    Emotionally Abused: No    Physically Abused: No    Sexually Abused: No    ROS Review of Systems  Constitutional:  Negative for chills and fever.  Eyes:  Negative for visual disturbance.  Respiratory:  Negative for chest tightness and shortness of breath.   Skin:  Positive for rash.  Neurological:  Negative for dizziness and headaches.    Objective:   Today's Vitals: BP 117/75   Pulse 86   Resp 16   Ht 5\' 4"  (1.626 m)   Wt 184 lb (83.5 kg)   SpO2 96%   BMI 31.58 kg/m   Physical Exam HENT:     Head: Normocephalic.     Mouth/Throat:     Mouth: Mucous membranes are moist.  Cardiovascular:     Rate and Rhythm: Normal rate.     Heart sounds: Normal heart sounds.  Pulmonary:     Effort: Pulmonary effort is normal.     Breath sounds: Normal breath sounds.  Skin:    Findings: Rash present.  Neurological:     Mental Status: She is alert.      Assessment & Plan:   Seborrheic dermatitis Assessment & Plan: The patient has been prescribed Ketoconazole 2% shampoo for scalp application, using 5 to 10 mL, which should be left on for three to five minutes before rinsing. It is to be used three times per week for four weeks, then reduced to once weekly to prevent relapse. Minor side effects, such as irritation or a burning sensation, may occur. Additionally, Betamethasone dipropionate 0.05% lotion has been prescribed for the scalp, to be applied once daily for two weeks, followed by twice weekly for maintenance. For the rash on the buttocks, the patient has been prescribed Kenalog 0.1% cream, which should be applied once daily until symptoms subside. A dermatology referral has also been placed for further evaluation and management.   Orders: -     Ketoconazole; Use two to three times per week for four weeks, then once a week thereafter to prevent relapse.  Dispense: 120 mL; Refill: 0 -     Ambulatory referral to Dermatology -      Betamethasone Dipropionate; Apply topically once daily for two weeks, then intermittently twice weekly to maintain effectiveness and prevent relapse.  Dispense: 60 mL; Refill: 0 -     Triamcinolone Acetonide; Apply 1 Application topically daily.  Dispense: 30 g; Refill: 0  IFG (impaired fasting glucose) -     Hemoglobin A1c  Vitamin D deficiency -     VITAMIN D 25 Hydroxy (Vit-D Deficiency, Fractures)  Need for hepatitis C screening test -     Hepatitis  C antibody  Encounter for screening for HIV -     HIV Antibody (routine testing w rflx)  TSH (thyroid-stimulating hormone deficiency) -     TSH + free T4  Other hyperlipidemia -     Lipid panel -     CMP14+EGFR -     CBC with Differential/Platelet  Breast cancer screening by mammogram -     3D Screening Mammogram, Left and Right   Note: This chart has been completed using Dragon Medical Dictation software, and while attempts have been made to ensure accuracy, certain words and phrases may not be transcribed as intended.    Follow-up: Return in about 3 months (around 10/20/2023).   Gilmore Laroche, FNP

## 2023-07-23 NOTE — Patient Instructions (Addendum)
 I appreciate the opportunity to provide care to you today!    Follow up:  3 months  Labs: please stop by the lab today to get your blood drawn (CBC, CMP, TSH, Lipid profile, HgA1c, Vit D)  Screening: HIV and Hep C  Schedule mammogram  Seborrheic Dermatitis - Treatment Plan A prescription for Ketoconazole 2% shampoo has been sent to your pharmacy. Five to 10 mL of shampoo should be left on for three to five minutes before rinsing off Use three times per week for four weeks, then once weekly thereafter to prevent relapse.  Minor side effects, such as irritation or a burning sensation, may occur with antifungal shampoo.  Betamethasone dipropionate 0.05% lotion has been prescribed for scalp application: Apply once daily for two weeks, then twice weekly thereafter to maintain symptom control.  Kenalog 0.1% cream has been sent to your pharmacy: Apply once daily to the rash on the buttocks until symptoms subside.   Referral: dermatology    Please continue to a heart-healthy diet and increase your physical activities. Try to exercise for at least five days a week.    It was a pleasure to see you and I look forward to continuing to work together on your health and well-being. Please do not hesitate to call the office if you need care or have questions about your care.  In case of emergency, please visit the Emergency Department for urgent care, or contact our clinic at [phone] to schedule an appointment. We're here to help you!   Have a wonderful day and week. With Gratitude, Gilmore Laroche MSN, FNP-BC

## 2023-07-24 LAB — HEPATITIS C ANTIBODY: Hep C Virus Ab: NONREACTIVE

## 2023-07-24 LAB — VITAMIN D 25 HYDROXY (VIT D DEFICIENCY, FRACTURES): Vit D, 25-Hydroxy: 18.8 ng/mL — ABNORMAL LOW (ref 30.0–100.0)

## 2023-07-24 LAB — CMP14+EGFR
ALT: 16 IU/L (ref 0–32)
AST: 17 IU/L (ref 0–40)
Albumin: 4.2 g/dL (ref 3.9–4.9)
Alkaline Phosphatase: 70 IU/L (ref 44–121)
BUN/Creatinine Ratio: 12 (ref 9–23)
BUN: 9 mg/dL (ref 6–24)
Bilirubin Total: 0.4 mg/dL (ref 0.0–1.2)
CO2: 23 mmol/L (ref 20–29)
Calcium: 9 mg/dL (ref 8.7–10.2)
Chloride: 102 mmol/L (ref 96–106)
Creatinine, Ser: 0.76 mg/dL (ref 0.57–1.00)
Globulin, Total: 3.1 g/dL (ref 1.5–4.5)
Glucose: 96 mg/dL (ref 70–99)
Potassium: 4 mmol/L (ref 3.5–5.2)
Sodium: 139 mmol/L (ref 134–144)
Total Protein: 7.3 g/dL (ref 6.0–8.5)
eGFR: 100 mL/min/{1.73_m2} (ref >59)

## 2023-07-24 LAB — CBC WITH DIFFERENTIAL/PLATELET
Basophils Absolute: 0 10*3/uL (ref 0.0–0.2)
Basos: 1 %
EOS (ABSOLUTE): 0.3 10*3/uL (ref 0.0–0.4)
Eos: 4 %
Hematocrit: 31.5 % — ABNORMAL LOW (ref 34.0–46.6)
Hemoglobin: 9.4 g/dL — ABNORMAL LOW (ref 11.1–15.9)
Immature Grans (Abs): 0 10*3/uL (ref 0.0–0.1)
Immature Granulocytes: 0 %
Lymphocytes Absolute: 1.6 10*3/uL (ref 0.7–3.1)
Lymphs: 22 %
MCH: 22.9 pg — ABNORMAL LOW (ref 26.6–33.0)
MCHC: 29.8 g/dL — ABNORMAL LOW (ref 31.5–35.7)
MCV: 77 fL — ABNORMAL LOW (ref 79–97)
Monocytes Absolute: 0.5 10*3/uL (ref 0.1–0.9)
Monocytes: 8 %
Neutrophils Absolute: 4.6 10*3/uL (ref 1.4–7.0)
Neutrophils: 65 %
Platelets: 423 10*3/uL (ref 150–450)
RBC: 4.11 x10E6/uL (ref 3.77–5.28)
RDW: 13.9 % (ref 11.7–15.4)
WBC: 7.1 10*3/uL (ref 3.4–10.8)

## 2023-07-24 LAB — LIPID PANEL
Chol/HDL Ratio: 2.8 ratio (ref 0.0–4.4)
Cholesterol, Total: 148 mg/dL (ref 100–199)
HDL: 53 mg/dL (ref >39)
LDL Chol Calc (NIH): 78 mg/dL (ref 0–99)
Triglycerides: 92 mg/dL (ref 0–149)
VLDL Cholesterol Cal: 17 mg/dL (ref 5–40)

## 2023-07-24 LAB — HEMOGLOBIN A1C
Est. average glucose Bld gHb Est-mCnc: 114 mg/dL
Hgb A1c MFr Bld: 5.6 % (ref 4.8–5.6)

## 2023-07-24 LAB — TSH+FREE T4
Free T4: 1.17 ng/dL (ref 0.82–1.77)
TSH: 2.37 u[IU]/mL (ref 0.450–4.500)

## 2023-07-24 LAB — HIV ANTIBODY (ROUTINE TESTING W REFLEX): HIV Screen 4th Generation wRfx: NONREACTIVE

## 2023-07-26 ENCOUNTER — Other Ambulatory Visit: Payer: Self-pay | Admitting: Family Medicine

## 2023-07-26 DIAGNOSIS — E559 Vitamin D deficiency, unspecified: Secondary | ICD-10-CM

## 2023-07-26 MED ORDER — VITAMIN D (ERGOCALCIFEROL) 1.25 MG (50000 UNIT) PO CAPS: 50000.0000 [IU] | ORAL_CAPSULE | ORAL | 1 refills | Status: DC

## 2023-07-26 NOTE — Progress Notes (Signed)
 Please inform the patient that a weekly vitamin D supplement has been sent to her pharmacy to take, as her vitamin D levels are low.  I also recommend increasing her intake of iron-rich foods, such as lean meats, leafy green vegetables (spinach, kale), beans, lentils, fortified cereals, nuts, and seeds, to help maintain optimal nutrient levels.

## 2023-07-29 ENCOUNTER — Ambulatory Visit (HOSPITAL_COMMUNITY): Payer: Medicaid Other

## 2023-07-30 ENCOUNTER — Other Ambulatory Visit (HOSPITAL_COMMUNITY): Payer: Self-pay | Admitting: Family Medicine

## 2023-07-30 DIAGNOSIS — Z1231 Encounter for screening mammogram for malignant neoplasm of breast: Secondary | ICD-10-CM

## 2023-08-04 ENCOUNTER — Ambulatory Visit (HOSPITAL_COMMUNITY)
Admission: RE | Admit: 2023-08-04 | Discharge: 2023-08-04 | Disposition: A | Discharge status: Home / Self Care | Payer: Medicaid Other | Source: Ambulatory Visit | Attending: Family Medicine | Admitting: Family Medicine

## 2023-08-04 ENCOUNTER — Encounter (HOSPITAL_COMMUNITY): Payer: Self-pay

## 2023-08-04 DIAGNOSIS — Z1231 Encounter for screening mammogram for malignant neoplasm of breast: Secondary | ICD-10-CM | POA: Insufficient documentation

## 2023-09-17 ENCOUNTER — Ambulatory Visit: Payer: Self-pay | Admitting: Family Medicine

## 2023-10-21 ENCOUNTER — Encounter: Payer: Self-pay | Admitting: Family Medicine

## 2023-10-21 ENCOUNTER — Ambulatory Visit: Payer: Medicaid Other | Admitting: Family Medicine

## 2023-10-21 VITALS — BP 109/66 | HR 74 | Resp 16 | Ht 65.0 in | Wt 187.0 lb

## 2023-10-21 DIAGNOSIS — R7301 Impaired fasting glucose: Secondary | ICD-10-CM

## 2023-10-21 DIAGNOSIS — E559 Vitamin D deficiency, unspecified: Secondary | ICD-10-CM

## 2023-10-21 DIAGNOSIS — L219 Seborrheic dermatitis, unspecified: Secondary | ICD-10-CM | POA: Diagnosis not present

## 2023-10-21 DIAGNOSIS — E7849 Other hyperlipidemia: Secondary | ICD-10-CM

## 2023-10-21 DIAGNOSIS — E038 Other specified hypothyroidism: Secondary | ICD-10-CM | POA: Diagnosis not present

## 2023-10-21 MED ORDER — KETOCONAZOLE 2 % EX SHAM: MEDICATED_SHAMPOO | CUTANEOUS | 0 refills | Status: AC

## 2023-10-21 MED ORDER — BETAMETHASONE DIPROPIONATE 0.05 % EX LOTN
TOPICAL_LOTION | CUTANEOUS | 0 refills | Status: DC
Start: 2023-10-21 — End: 2024-03-27

## 2023-10-21 NOTE — Patient Instructions (Addendum)
 I appreciate the opportunity to provide care to you today!    Follow up:  3 months  Labs: please stop by the lab today to get your blood drawn (CBC, CMP, TSH, Lipid profile, HgA1c, Vit D)    Please stop by your local pharmacy and get your Tdap and Shingles vaccine  Referrals today-   Attached with your AVS, you will find valuable resources for self-education. I highly recommend dedicating some time to thoroughly examine them.   Please continue to a heart-healthy diet and increase your physical activities. Try to exercise for at least five days a week.    It was a pleasure to see you and I look forward to continuing to work together on your health and well-being. Please do not hesitate to call the office if you need care or have questions about your care.  In case of emergency, please visit the Emergency Department for urgent care, or contact our clinic at [phone] to schedule an appointment. We're here to help you!   Have a wonderful day and week. With Gratitude, Beckey Polkowski MSN, FNP-BC

## 2023-10-21 NOTE — Assessment & Plan Note (Signed)
 Refills sent to the pharmacy.

## 2023-10-21 NOTE — Progress Notes (Addendum)
 Established Patient Office Visit  Subjective:  Patient ID: Cynthia Molina, female    DOB: Dec 16, 1980  Age: 43 y.o. MRN: [id number]  CC:  Chief Complaint  Patient presents with   Follow-up    Needs a refill of her shampoo     HPI Cynthia Molina is a 43 y.o. female with past medical history of seborrheic dermatitis presents for f/u of  chronic medical conditions. For the details of today's visit, please refer to the assessment and plan.   The patient was discharged without being seen. No physical exam was performed; however, her medication refills were sent.   Past Medical History:  Diagnosis Date   Abnormal Papanicolaou smear of cervix with positive human papilloma virus (HPV) test 11/21/2019   LSIL will get colpo____________   Allergy    Anemia 11/15/2019   Anxiety    Bacterial vaginosis    Depression    Fibroid 09/29/2017   Heart murmur    HPV (human papilloma virus) infection    Hx of trichomoniasis    Miscarriage    Thickened endometrium 09/29/2017   Will get bx    Past Surgical History:  Procedure Laterality Date   CESAREAN SECTION     elective abortion      Family History  Problem Relation Age of Onset   Other Father        collapsed lung; breathing issues   Asthma Daughter    Anxiety disorder Daughter    Depression Daughter     Social History   Socioeconomic History   Marital status: Divorced    Spouse name: Not on file   Number of children: 1   Years of education: Not on file   Highest education level: 10th grade  Occupational History   Not on file  Tobacco Use   Smoking status: Never   Smokeless tobacco: Never  Vaping Use   Vaping status: Never Used  Substance and Sexual Activity   Alcohol use: No   Drug use: No   Sexual activity: Yes    Birth control/protection: Condom, None  Other Topics Concern   Not on file  Social History Narrative   Not on file   Social Drivers of Health   Financial Resource Strain: Patient Declined  (07/21/2023)   Overall Financial Resource Strain (CARDIA)    Difficulty of Paying Living Expenses: Patient declined  Food Insecurity: No Food Insecurity (07/21/2023)   Hunger Vital Sign    Worried About Running Out of Food in the Last Year: Never true    Ran Out of Food in the Last Year: Never true  Transportation Needs: No Transportation Needs (07/21/2023)   PRAPARE - Administrator, Civil Service (Medical): No    Lack of Transportation (Non-Medical): No  Physical Activity: Insufficiently Active (07/21/2023)   Exercise Vital Sign    Days of Exercise per Week: 5 days    Minutes of Exercise per Session: 20 min  Stress: No Stress Concern Present (07/21/2023)   [employer] of Occupational Health - Occupational Stress Questionnaire    Feeling of Stress : Not at all  Social Connections: Unknown (07/21/2023)   Social Connection and Isolation Panel [NHANES]    Frequency of Communication with Friends and Family: Twice a week    Frequency of Social Gatherings with Friends and Family: Patient declined    Attends Religious Services: 1 to 4 times per year    Active Member of [employer] or Organizations: Not on file  Attends Banker Meetings: Not on file    Marital Status: Divorced  Intimate Partner Violence: Not At Risk (03/31/2023)   Received from [facility]   Humiliation, Afraid, Rape, and Kick questionnaire    Fear of Current or Ex-Partner: No    Emotionally Abused: No    Physically Abused: No    Sexually Abused: No    Outpatient Medications Prior to Visit  Medication Sig Dispense Refill   Boric Acid Vaginal 600 MG SUPP Place 1 suppository vaginally at bedtime. For 14 days 14 suppository 1   nystatin  cream (MYCOSTATIN ) Apply 1 Application topically 2 (two) times daily. 30 g 3   triamcinolone  cream (KENALOG ) 0.1 % Apply 1 Application topically daily. 30 g 0   Vitamin D , Ergocalciferol , (DRISDOL ) 1.25 MG (50000 UNIT) CAPS capsule Take 1 capsule (50,000 Units  total) by mouth every 7 (seven) days. 20 capsule 1   betamethasone  dipropionate 0.05 % lotion Apply topically once daily for two weeks, then intermittently twice weekly to maintain effectiveness and prevent relapse. 60 mL 0   ketoconazole  (NIZORAL ) 2 % shampoo Use two to three times per week for four weeks, then once a week thereafter to prevent relapse. 120 mL 0   No facility-administered medications prior to visit.    Allergies  Allergen Reactions   Compazine [Prochlorperazine Edisylate] Other (See Comments)    "jittery"    ROS Review of Systems  Constitutional:  Negative for chills and fever.  Eyes:  Negative for visual disturbance.  Respiratory:  Negative for chest tightness and shortness of breath.   Neurological:  Negative for dizziness and headaches.      Objective:     Physical Exam HENT:     Head: Normocephalic.     Mouth/Throat:     Mouth: Mucous membranes are moist.  Cardiovascular:     Rate and Rhythm: Normal rate.     Heart sounds: Normal heart sounds.  Pulmonary:     Effort: Pulmonary effort is normal.     Breath sounds: Normal breath sounds.  Skin:    Findings: Lesion (scalp) present.  Neurological:     Mental Status: She is alert.     BP 109/66   Pulse 74   Resp 16   Ht 5\' 5"  (1.651 m)   Wt 187 lb (84.8 kg)   SpO2 98%   BMI 31.12 kg/m  Wt Readings from Last 3 Encounters:  10/21/23 187 lb (84.8 kg)  07/23/23 184 lb (83.5 kg)  04/15/23 180 lb 8 oz (81.9 kg)    Lab Results  Component Value Date   TSH 2.370 07/23/2023   Lab Results  Component Value Date   WBC 7.1 07/23/2023   HGB 9.4 (L) 07/23/2023   HCT 31.5 (L) 07/23/2023   MCV 77 (L) 07/23/2023   PLT 423 07/23/2023   Lab Results  Component Value Date   NA 139 07/23/2023   K 4.0 07/23/2023   CO2 23 07/23/2023   GLUCOSE 96 07/23/2023   BUN 9 07/23/2023   CREATININE 0.76 07/23/2023   BILITOT 0.4 07/23/2023   ALKPHOS 70 07/23/2023   AST 17 07/23/2023   ALT 16 07/23/2023    PROT 7.3 07/23/2023   ALBUMIN 4.2 07/23/2023   CALCIUM 9.0 07/23/2023   ANIONGAP 8 05/07/2018   EGFR 100 07/23/2023   Lab Results  Component Value Date   CHOL 148 07/23/2023   Lab Results  Component Value Date   HDL 53 07/23/2023   Lab  Results  Component Value Date   LDLCALC 78 07/23/2023   Lab Results  Component Value Date   TRIG 92 07/23/2023   Lab Results  Component Value Date   CHOLHDL 2.8 07/23/2023   Lab Results  Component Value Date   HGBA1C 5.6 07/23/2023      Assessment & Plan:  Seborrheic dermatitis Assessment & Plan: Refills sent to the pharmacy  Orders: -     Betamethasone  Dipropionate; Apply topically once daily for two weeks, then intermittently twice weekly to maintain effectiveness and prevent relapse.  Dispense: 60 mL; Refill: 0 -     Ketoconazole ; Use two to three times per week for four weeks, then once a week thereafter to prevent relapse.  Dispense: 120 mL; Refill: 0  IFG (impaired fasting glucose) -     Hemoglobin A1c  Vitamin D  deficiency -     VITAMIN D  25 Hydroxy (Vit-D Deficiency, Fractures)  TSH (thyroid -stimulating hormone deficiency) -     TSH + free T4  Other hyperlipidemia -     Lipid panel -     CMP14+EGFR -     CBC with Differential/Platelet   Note: This chart has been completed using Engineer, civil (consulting) software, and while attempts have been made to ensure accuracy, certain words and phrases may not be transcribed as intended.   Follow-up: Return in about 3 months (around 01/21/2024).   Tanette Chauca, FNP

## 2023-10-22 ENCOUNTER — Ambulatory Visit: Payer: Self-pay | Admitting: Family Medicine

## 2023-10-22 DIAGNOSIS — D508 Other iron deficiency anemias: Secondary | ICD-10-CM

## 2023-10-22 LAB — CBC WITH DIFFERENTIAL/PLATELET
Basophils Absolute: 0.1 10*3/uL (ref 0.0–0.2)
Basos: 1 %
EOS (ABSOLUTE): 0.4 10*3/uL (ref 0.0–0.4)
Eos: 4 %
Hematocrit: 31.1 % — ABNORMAL LOW (ref 34.0–46.6)
Hemoglobin: 9.2 g/dL — ABNORMAL LOW (ref 11.1–15.9)
Immature Grans (Abs): 0 10*3/uL (ref 0.0–0.1)
Immature Granulocytes: 0 %
Lymphocytes Absolute: 2.4 10*3/uL (ref 0.7–3.1)
Lymphs: 26 %
MCH: 22.8 pg — ABNORMAL LOW (ref 26.6–33.0)
MCHC: 29.6 g/dL — ABNORMAL LOW (ref 31.5–35.7)
MCV: 77 fL — ABNORMAL LOW (ref 79–97)
Monocytes Absolute: 0.7 10*3/uL (ref 0.1–0.9)
Monocytes: 7 %
Neutrophils Absolute: 5.6 10*3/uL (ref 1.4–7.0)
Neutrophils: 62 %
Platelets: 401 10*3/uL (ref 150–450)
RBC: 4.04 x10E6/uL (ref 3.77–5.28)
RDW: 14.2 % (ref 11.7–15.4)
WBC: 9.2 10*3/uL (ref 3.4–10.8)

## 2023-10-22 LAB — HEMOGLOBIN A1C
Est. average glucose Bld gHb Est-mCnc: 111 mg/dL
Hgb A1c MFr Bld: 5.5 % (ref 4.8–5.6)

## 2023-10-22 LAB — CMP14+EGFR
ALT: 13 IU/L (ref 0–32)
AST: 9 IU/L (ref 0–40)
Albumin: 4.1 g/dL (ref 3.9–4.9)
Alkaline Phosphatase: 71 IU/L (ref 44–121)
BUN/Creatinine Ratio: 15 (ref 9–23)
BUN: 13 mg/dL (ref 6–24)
Bilirubin Total: <0.2 mg/dL (ref 0.0–1.2)
CO2: 20 mmol/L (ref 20–29)
Calcium: 9 mg/dL (ref 8.7–10.2)
Chloride: 104 mmol/L (ref 96–106)
Creatinine, Ser: 0.85 mg/dL (ref 0.57–1.00)
Globulin, Total: 3.1 g/dL (ref 1.5–4.5)
Glucose: 92 mg/dL (ref 70–99)
Potassium: 4.8 mmol/L (ref 3.5–5.2)
Sodium: 136 mmol/L (ref 134–144)
Total Protein: 7.2 g/dL (ref 6.0–8.5)
eGFR: 87 mL/min/{1.73_m2} (ref >59)

## 2023-10-22 LAB — LIPID PANEL
Chol/HDL Ratio: 3.1 ratio (ref 0.0–4.4)
Cholesterol, Total: 168 mg/dL (ref 100–199)
HDL: 55 mg/dL (ref >39)
LDL Chol Calc (NIH): 98 mg/dL (ref 0–99)
Triglycerides: 80 mg/dL (ref 0–149)
VLDL Cholesterol Cal: 15 mg/dL (ref 5–40)

## 2023-10-22 LAB — VITAMIN D 25 HYDROXY (VIT D DEFICIENCY, FRACTURES): Vit D, 25-Hydroxy: 40.9 ng/mL (ref 30.0–100.0)

## 2023-10-22 LAB — TSH+FREE T4
Free T4: 1.17 ng/dL (ref 0.82–1.77)
TSH: 2.83 u[IU]/mL (ref 0.450–4.500)

## 2023-10-22 MED ORDER — IRON (FERROUS SULFATE) 325 (65 FE) MG PO TABS: 325.0000 mg | ORAL_TABLET | Freq: Every day | ORAL | 1 refills | Status: DC

## 2024-01-21 ENCOUNTER — Other Ambulatory Visit: Payer: Self-pay | Admitting: Family Medicine

## 2024-01-21 ENCOUNTER — Ambulatory Visit: Admitting: Family Medicine

## 2024-01-21 DIAGNOSIS — D508 Other iron deficiency anemias: Secondary | ICD-10-CM

## 2024-01-21 MED ORDER — IRON (FERROUS SULFATE) 325 (65 FE) MG PO TABS: 325.0000 mg | ORAL_TABLET | Freq: Every day | ORAL | 1 refills | Status: DC

## 2024-01-21 NOTE — Telephone Encounter (Unsigned)
 Copied from CRM [id number]. Topic: Clinical - Medication Refill >> Jan 21, 2024  8:09 AM Delon HERO wrote: Medication: Iron , Ferrous Sulfate , 325 (65 Fe) MG TABS [[id number]]  Has the patient contacted their pharmacy? Yes (Agent: If no, request that the patient contact the pharmacy for the refill. If patient does not wish to contact the pharmacy document the reason why and proceed with request.) (Agent: If yes, when and what did the pharmacy advise?)  This is the patient's preferred pharmacy:  [facility] [phone] - [place], [place] - [address] AT [facility] [address] & [address] [place] [address] Phone: [phone] Fax: [phone]  Is this the correct pharmacy for this prescription? Yes If no, delete pharmacy and type the correct one.   Has the prescription been filled recently? Yes  Is the patient out of the medication? Yes  Has the patient been seen for an appointment in the last year OR does the patient have an upcoming appointment? Yes  Can we respond through MyChart? {yes/no:20286}  Agent: Please be advised that Rx refills may take up to 3 business days. We ask that you follow-up with your pharmacy.

## 2024-03-27 ENCOUNTER — Ambulatory Visit: Admitting: Dermatology

## 2024-03-27 ENCOUNTER — Encounter: Payer: Self-pay | Admitting: Dermatology

## 2024-03-27 DIAGNOSIS — L219 Seborrheic dermatitis, unspecified: Secondary | ICD-10-CM | POA: Diagnosis not present

## 2024-03-27 MED ORDER — FLUCONAZOLE 100 MG PO TABS: 100.0000 mg | ORAL_TABLET | Freq: Every day | ORAL | 0 refills | Status: AC

## 2024-03-27 MED ORDER — FLUOCINOLONE ACETONIDE SCALP 0.01 % EX OIL: TOPICAL_OIL | CUTANEOUS | 5 refills | Status: AC

## 2024-03-27 MED ORDER — BETAMETHASONE DIPROPIONATE 0.05 % EX LOTN: TOPICAL_LOTION | CUTANEOUS | 5 refills | Status: AC

## 2024-03-27 NOTE — Progress Notes (Signed)
   New Patient Visit   Subjective  Cynthia Molina is a 43 y.o. female who presents for a NEW PATIENT appointment to be examined for the concerns as listed below.  SebDerm: Pt has been having scalp issues since teenage years that was mild and easily manageable. However, it has gotten worse over the last per her stylist. Pt stated her PCP Rx ketoconazole  shampoo 2% and betamethasone  dipropionate lotion to use while she waited for her OV with dermatology. Both Rx have helped her itching and flaking. Prior to using prescriptions she rated her itch 8 out of 10 - currently she is 2 out of 10.    Are you nursing, pregnant or trying to conceive? No    No Hx of Bx.  No family Hx of skin cancer.   The following portions of the chart were reviewed this encounter and updated as appropriate: medications, allergies, medical history  Review of Systems:  No other skin or systemic complaints except as noted in HPI or Assessment and Plan.  Objective  Well appearing patient in no apparent distress; mood and affect are within normal limits.   A focused examination was performed of the following areas: scalp   Relevant exam findings are noted in the Assessment and Plan.    Assessment & Plan   Seborrheic dermatitis of the scalp Chronic seborrheic dermatitis of the scalp with persistent flaking despite initial improvement in itching with ketoconazole  shampoo and betamethasone  lotion. Current regimen is too drying, contributing to persistent symptoms. Mild greasy flakiness observed on exam, consistent with seborrheic dermatitis. Oiling of the scalp may contribute to symptoms.  - Discontinue ketoconazole  shampoo due to excessive drying. - Initiate DHS zinc shampoo once every one to two weeks, allowing it to sit for two minutes before rinsing and following with a hydrating shampoo and conditioner. - Recommend DermaSmooth (fluocinolone in oil) scalp treatment to be applied a couple of hours before  shampooing with DHS zinc, to be used weekly or biweekly based on symptom recurrence. - Refill betamethasone  dipropionate lotion for as-needed spot treatment of itch. - Prescribe oral fluconazole  100 mg, one tablet daily for three days, with refills to last one year. - Educate on the chronic nature of the condition and the need for ongoing management. SEBORRHEIC DERMATITIS   Related Medications triamcinolone  cream (KENALOG ) 0.1 % Apply 1 Application topically daily. ketoconazole  (NIZORAL ) 2 % shampoo Use two to three times per week for four weeks, then once a week thereafter to prevent relapse. betamethasone  dipropionate 0.05 % lotion Apply topically once daily for two weeks, then intermittently twice weekly to maintain effectiveness and prevent relapse. Fluocinolone Acetonide Scalp (DERMA-SMOOTHE/FS SCALP) 0.01 % OIL apply to scalp a few hours prior to shampooing then rinse. fluconazole  (DIFLUCAN ) 100 MG tablet Take 1 tablet (100 mg total) by mouth daily.  Return in about 1 year (around 03/27/2025) for SEB DERM.   Documentation: I have reviewed the above documentation for accuracy and completeness, and I agree with the above.  I, Shirron Maranda, CMA, am acting as scribe for [employer], DO.   Delon Lenis, DO

## 2024-03-27 NOTE — Patient Instructions (Addendum)
 VISIT SUMMARY:  During your visit, we discussed your ongoing issues with itching and flaking of the scalp. We reviewed your current treatment regimen and made some adjustments to better manage your symptoms.  YOUR PLAN:  -SEBORRHEIC DERMATITIS OF THE SCALP:  Seborrheic dermatitis is a chronic skin condition that causes flaking and itching, often on the scalp. We will discontinue the ketoconazole  shampoo as it may be too drying for your scalp.  Instead, you will start using DHS zinc shampoo once every one to two weeks, letting it sit for two minutes before rinsing, and then follow with a hydrating shampoo and conditioner.   Additionally, you will use DermaSmooth (fluocinolone in oil) scalp treatment a couple of hours before shampooing with DHS zinc, on a weekly or biweekly basis depending on your symptoms.  You can continue using betamethasone  dipropionate lotion as needed for spot treatment of itching.  We have also prescribed oral fluconazole  100 mg to be taken once daily for three days, with refills available for one year. Remember, this is a chronic condition that requires ongoing management.  INSTRUCTIONS:  Please follow the new treatment plan as discussed. Use the DHS zinc shampoo once every one to two weeks, and follow it with a hydrating shampoo and conditioner.  Apply DermaSmooth scalp oil treatment a couple of hours before shampooing with DHS zinc, on a weekly or biweekly basis.  Continue using betamethasone  dipropionate lotion as needed for itching. Take the prescribed oral fluconazole  100 mg once daily for three days. If you have any questions or if your symptoms do not improve, please schedule a follow-up appointment.       Important Information  Due to recent changes in healthcare laws, you may see results of your pathology and/or laboratory studies on MyChart before the doctors have had a chance to review them. We understand that in some cases there may be results that are  confusing or concerning to you. Please understand that not all results are received at the same time and often the doctors may need to interpret multiple results in order to provide you with the best plan of care or course of treatment. Therefore, we ask that you please give us  2 business days to thoroughly review all your results before contacting the office for clarification. Should we see a critical lab result, you will be contacted sooner.   If You Need Anything After Your Visit  If you have any questions or concerns for your doctor, please call our main line at [phone] If no one answers, please leave a voicemail as directed and we will return your call as soon as possible. Messages left after 4 pm will be answered the following business day.   You may also send us  a message via MyChart. We typically respond to MyChart messages within 1-2 business days.  For prescription refills, please ask your pharmacy to contact our office. Our fax number is [phone].  If you have an urgent issue when the clinic is closed that cannot wait until the next business day, you can page your doctor at the number below.    Please note that while we do our best to be available for urgent issues outside of office hours, we are not available 24/7.   If you have an urgent issue and are unable to reach us , you may choose to seek medical care at your doctor's office, retail clinic, urgent care center, or emergency room.  If you have a medical emergency, please immediately call 911  or go to the emergency department. In the event of inclement weather, please call our main line at [phone] for an update on the status of any delays or closures.  Dermatology Medication Tips: Please keep the boxes that topical medications come in in order to help keep track of the instructions about where and how to use these. Pharmacies typically print the medication instructions only on the boxes and not directly on the  medication tubes.   If your medication is too expensive, please contact our office at [phone] or send us  a message through MyChart.   We are unable to tell what your co-pay for medications will be in advance as this is different depending on your insurance coverage. However, we may be able to find a substitute medication at lower cost or fill out paperwork to get insurance to cover a needed medication.   If a prior authorization is required to get your medication covered by your insurance company, please allow us  1-2 business days to complete this process.  Drug prices often vary depending on where the prescription is filled and some pharmacies may offer cheaper prices.  The website www.goodrx.com contains coupons for medications through different pharmacies. The prices here do not account for what the cost may be with help from insurance (it may be cheaper with your insurance), but the website can give you the price if you did not use any insurance.  - You can print the associated coupon and take it with your prescription to the pharmacy.  - You may also stop by our office during regular business hours and pick up a GoodRx coupon card.  - If you need your prescription sent electronically to a different pharmacy, notify our office through [facility] or by phone at [phone]

## 2024-04-07 ENCOUNTER — Ambulatory Visit: Admitting: Family Medicine

## 2024-04-07 ENCOUNTER — Encounter: Payer: Self-pay | Admitting: Family Medicine

## 2024-04-07 VITALS — BP 127/72 | HR 84 | Ht 65.0 in | Wt 193.0 lb

## 2024-04-07 DIAGNOSIS — E7849 Other hyperlipidemia: Secondary | ICD-10-CM | POA: Diagnosis not present

## 2024-04-07 DIAGNOSIS — E559 Vitamin D deficiency, unspecified: Secondary | ICD-10-CM

## 2024-04-07 DIAGNOSIS — B354 Tinea corporis: Secondary | ICD-10-CM

## 2024-04-07 DIAGNOSIS — R7301 Impaired fasting glucose: Secondary | ICD-10-CM

## 2024-04-07 DIAGNOSIS — E038 Other specified hypothyroidism: Secondary | ICD-10-CM

## 2024-04-07 MED ORDER — CLOTRIMAZOLE 1 % EX CREA: 1.0000 | TOPICAL_CREAM | Freq: Two times a day (BID) | CUTANEOUS | 0 refills | Status: AC

## 2024-04-07 NOTE — Patient Instructions (Addendum)
 I appreciate the opportunity to provide care to you today!    Follow up:  4 months  Labs: please stop by the lab during the week to get your blood drawn (CBC, CMP, TSH, Lipid profile, HgA1c, Vit D)   Tinea corporis (fungal skin infection) -Start applying clotrimazole  1% cream to the affected area twice daily until the rash clears, then continue for 1 additional week to prevent recurrence. -Keep the area clean and dry. -Avoid sharing towels or clothing to prevent spread. -Follow up if symptoms persist, worsen, or spread to other areas.   Please follow up if your symptoms worsen or fail to improve.    Please continue to a heart-healthy diet and increase your physical activities. Try to exercise for at least five days a week.    It was a pleasure to see you and I look forward to continuing to work together on your health and well-being. Please do not hesitate to call the office if you need care or have questions about your care.  In case of emergency, please visit the Emergency Department for urgent care, or contact our clinic at [phone] to schedule an appointment. We're here to help you!   Have a wonderful day and week. With Gratitude, Meade JENEANE Gerlach MSN, FNP-BC, PMHNP-BC

## 2024-04-07 NOTE — Progress Notes (Signed)
 Established Patient Office Visit  Subjective:  Patient ID: Cynthia Molina, female    DOB: 03-25-1981  Age: 43 y.o. MRN: [id number]  CC:  Chief Complaint  Patient presents with   spots    Skin spots on right leg   Care Management    Three month follow up    HPI Cynthia Molina is a 43 y.o. female with past medical history of Fibroid presents for f/u of  chronic medical conditions.  For the details of today's visit, please refer to the assessment and plan.      Off and on for about a year  Past Medical History:  Diagnosis Date   Abnormal Papanicolaou smear of cervix with positive human papilloma virus (HPV) test 11/21/2019   LSIL will get colpo____________   Allergy    Anemia 11/15/2019   Anxiety    Bacterial vaginosis    Depression    Fibroid 09/29/2017   Heart murmur    HPV (human papilloma virus) infection    Hx of trichomoniasis    Miscarriage    Thickened endometrium 09/29/2017   Will get bx    Past Surgical History:  Procedure Laterality Date   CESAREAN SECTION     elective abortion      Family History  Problem Relation Age of Onset   Other Father        collapsed lung; breathing issues   Asthma Daughter    Anxiety disorder Daughter    Depression Daughter     Social History   Socioeconomic History   Marital status: Divorced    Spouse name: Not on file   Number of children: 1   Years of education: Not on file   Highest education level: 9th grade  Occupational History   Not on file  Tobacco Use   Smoking status: Never   Smokeless tobacco: Never  Vaping Use   Vaping status: Never Used  Substance and Sexual Activity   Alcohol use: No   Drug use: No   Sexual activity: Yes    Birth control/protection: Condom, None  Other Topics Concern   Not on file  Social History Narrative   Not on file   Social Drivers of Health   Financial Resource Strain: Medium Risk (04/06/2024)   Overall Financial Resource Strain (CARDIA)    Difficulty of  Paying Living Expenses: Somewhat hard  Food Insecurity: Food Insecurity Present (04/06/2024)   Hunger Vital Sign    Worried About Running Out of Food in the Last Year: Sometimes true    Ran Out of Food in the Last Year: Sometimes true  Transportation Needs: No Transportation Needs (04/06/2024)   PRAPARE - Administrator, Civil Service (Medical): No    Lack of Transportation (Non-Medical): No  Physical Activity: Insufficiently Active (04/06/2024)   Exercise Vital Sign    Days of Exercise per Week: 3 days    Minutes of Exercise per Session: 20 min  Stress: No Stress Concern Present (04/06/2024)   [employer] of Occupational Health - Occupational Stress Questionnaire    Feeling of Stress: Only a little  Social Connections: Socially Isolated (04/06/2024)   Social Connection and Isolation Panel    Frequency of Communication with Friends and Family: Once a week    Frequency of Social Gatherings with Friends and Family: Once a week    Attends Religious Services: 1 to 4 times per year    Active Member of Clubs or Organizations: No    Attends  Club or Organization Meetings: Not on file    Marital Status: Divorced  Intimate Partner Violence: Not At Risk (03/31/2023)   Received from [facility]   Humiliation, Afraid, Rape, and Kick questionnaire    Within the last year, have you been afraid of your partner or ex-partner?: No    Within the last year, have you been humiliated or emotionally abused in other ways by your partner or ex-partner?: No    Within the last year, have you been kicked, hit, slapped, or otherwise physically hurt by your partner or ex-partner?: No    Within the last year, have you been raped or forced to have any kind of sexual activity by your partner or ex-partner?: No    Outpatient Medications Prior to Visit  Medication Sig Dispense Refill   betamethasone  dipropionate 0.05 % lotion Apply topically once daily for two weeks, then intermittently twice weekly  to maintain effectiveness and prevent relapse. 60 mL 5   Boric Acid Vaginal 600 MG SUPP Place 1 suppository vaginally at bedtime. For 14 days 14 suppository 1   fluconazole  (DIFLUCAN ) 100 MG tablet Take 1 tablet (100 mg total) by mouth daily. 3 tablet 0   Fluocinolone Acetonide Scalp (DERMA-SMOOTHE/FS SCALP) 0.01 % OIL apply to scalp a few hours prior to shampooing then rinse. 118 mL 5   Iron , Ferrous Sulfate , 325 (65 Fe) MG TABS Take 325 mg by mouth daily. 30 tablet 1   ketoconazole  (NIZORAL ) 2 % shampoo Use two to three times per week for four weeks, then once a week thereafter to prevent relapse. 120 mL 0   nystatin  cream (MYCOSTATIN ) Apply 1 Application topically 2 (two) times daily. 30 g 3   triamcinolone  cream (KENALOG ) 0.1 % Apply 1 Application topically daily. 30 g 0   Vitamin D , Ergocalciferol , (DRISDOL ) 1.25 MG (50000 UNIT) CAPS capsule Take 1 capsule (50,000 Units total) by mouth every 7 (seven) days. 20 capsule 1   No facility-administered medications prior to visit.    Allergies  Allergen Reactions   Compazine [Prochlorperazine Edisylate] Other (See Comments)    jittery    ROS Review of Systems  Constitutional:  Negative for chills and fever.  Eyes:  Negative for visual disturbance.  Respiratory:  Negative for chest tightness and shortness of breath.   Skin:  Positive for rash.  Neurological:  Negative for dizziness and headaches.      Objective:    Physical Exam HENT:     Head: Normocephalic.     Mouth/Throat:     Mouth: Mucous membranes are moist.  Cardiovascular:     Rate and Rhythm: Normal rate.     Heart sounds: Normal heart sounds.  Pulmonary:     Effort: Pulmonary effort is normal.     Breath sounds: Normal breath sounds.  Skin:    Findings: Rash present.  Neurological:     Mental Status: She is alert.     BP 127/72   Pulse 84   Ht 5' 5 (1.651 m)   Wt 193 lb (87.5 kg)   SpO2 97%   BMI 32.12 kg/m  Wt Readings from Last 3 Encounters:   04/07/24 193 lb (87.5 kg)  10/21/23 187 lb (84.8 kg)  07/23/23 184 lb (83.5 kg)    Lab Results  Component Value Date   TSH 2.830 10/21/2023   Lab Results  Component Value Date   WBC 9.2 10/21/2023   HGB 9.2 (L) 10/21/2023   HCT 31.1 (L) 10/21/2023  MCV 77 (L) 10/21/2023   PLT 401 10/21/2023   Lab Results  Component Value Date   NA 136 10/21/2023   K 4.8 10/21/2023   CO2 20 10/21/2023   GLUCOSE 92 10/21/2023   BUN 13 10/21/2023   CREATININE 0.85 10/21/2023   BILITOT <0.2 10/21/2023   ALKPHOS 71 10/21/2023   AST 9 10/21/2023   ALT 13 10/21/2023   PROT 7.2 10/21/2023   ALBUMIN 4.1 10/21/2023   CALCIUM 9.0 10/21/2023   ANIONGAP 8 05/07/2018   EGFR 87 10/21/2023   Lab Results  Component Value Date   CHOL 168 10/21/2023   Lab Results  Component Value Date   HDL 55 10/21/2023   Lab Results  Component Value Date   LDLCALC 98 10/21/2023   Lab Results  Component Value Date   TRIG 80 10/21/2023   Lab Results  Component Value Date   CHOLHDL 3.1 10/21/2023   Lab Results  Component Value Date   HGBA1C 5.5 10/21/2023      Assessment & Plan:  Tinea corporis Assessment & Plan: Start applying clotrimazole  1% cream to the affected area twice daily until the rash clears, then continue for 1 additional week to prevent recurrence. -Keep the area clean and dry. -Avoid sharing towels or clothing to prevent spread. -Follow up if symptoms persist, worsen, or spread to other areas.   Orders: -     Clotrimazole ; Apply 1 Application topically 2 (two) times daily.  Dispense: 30 g; Refill: 0  IFG (impaired fasting glucose) -     Hemoglobin A1c  Vitamin D  deficiency -     VITAMIN D  25 Hydroxy (Vit-D Deficiency, Fractures)  TSH (thyroid -stimulating hormone deficiency) -     TSH + free T4  Other hyperlipidemia -     Lipid panel -     CMP14+EGFR -     CBC with Differential/Platelet  Note: This chart has been completed using Engineer, Civil (consulting) software, and  while attempts have been made to ensure accuracy, certain words and phrases may not be transcribed as intended.    Follow-up: Return in about 4 months (around 08/05/2024).   Neka Bise  Z Bacchus, FNP

## 2024-04-10 DIAGNOSIS — B354 Tinea corporis: Secondary | ICD-10-CM | POA: Insufficient documentation

## 2024-04-10 NOTE — Assessment & Plan Note (Signed)
 Start applying clotrimazole  1% cream to the affected area twice daily until the rash clears, then continue for 1 additional week to prevent recurrence. -Keep the area clean and dry. -Avoid sharing towels or clothing to prevent spread. -Follow up if symptoms persist, worsen, or spread to other areas.

## 2024-04-12 DIAGNOSIS — R7301 Impaired fasting glucose: Secondary | ICD-10-CM | POA: Diagnosis not present

## 2024-04-12 DIAGNOSIS — E038 Other specified hypothyroidism: Secondary | ICD-10-CM | POA: Diagnosis not present

## 2024-04-12 DIAGNOSIS — E7849 Other hyperlipidemia: Secondary | ICD-10-CM | POA: Diagnosis not present

## 2024-04-12 DIAGNOSIS — E559 Vitamin D deficiency, unspecified: Secondary | ICD-10-CM | POA: Diagnosis not present

## 2024-04-13 LAB — CMP14+EGFR
ALT: 8 IU/L (ref 0–32)
AST: 11 IU/L (ref 0–40)
Albumin: 4.2 g/dL (ref 3.9–4.9)
Alkaline Phosphatase: 70 IU/L (ref 41–116)
BUN/Creatinine Ratio: 9 (ref 9–23)
BUN: 7 mg/dL (ref 6–24)
Bilirubin Total: 0.3 mg/dL (ref 0.0–1.2)
CO2: 24 mmol/L (ref 20–29)
Calcium: 9.6 mg/dL (ref 8.7–10.2)
Chloride: 101 mmol/L (ref 96–106)
Creatinine, Ser: 0.78 mg/dL (ref 0.57–1.00)
Globulin, Total: 3 g/dL (ref 1.5–4.5)
Glucose: 91 mg/dL (ref 70–99)
Potassium: 4 mmol/L (ref 3.5–5.2)
Sodium: 139 mmol/L (ref 134–144)
Total Protein: 7.2 g/dL (ref 6.0–8.5)
eGFR: 96 mL/min/1.73 (ref >59)

## 2024-04-13 LAB — CBC WITH DIFFERENTIAL/PLATELET
Basophils Absolute: 0.1 x10E3/uL (ref 0.0–0.2)
Basos: 1 %
EOS (ABSOLUTE): 0.3 x10E3/uL (ref 0.0–0.4)
Eos: 4 %
Hematocrit: 37.1 % (ref 34.0–46.6)
Hemoglobin: 11.8 g/dL (ref 11.1–15.9)
Immature Grans (Abs): 0 x10E3/uL (ref 0.0–0.1)
Immature Granulocytes: 0 %
Lymphocytes Absolute: 2.6 x10E3/uL (ref 0.7–3.1)
Lymphs: 31 %
MCH: 28.2 pg (ref 26.6–33.0)
MCHC: 31.8 g/dL (ref 31.5–35.7)
MCV: 89 fL (ref 79–97)
Monocytes Absolute: 0.6 x10E3/uL (ref 0.1–0.9)
Monocytes: 8 %
Neutrophils Absolute: 4.7 x10E3/uL (ref 1.4–7.0)
Neutrophils: 56 %
Platelets: 371 x10E3/uL (ref 150–450)
RBC: 4.19 x10E6/uL (ref 3.77–5.28)
RDW: 12.6 % (ref 11.7–15.4)
WBC: 8.3 x10E3/uL (ref 3.4–10.8)

## 2024-04-13 LAB — TSH+FREE T4
Free T4: 1.32 ng/dL (ref 0.82–1.77)
TSH: 3.09 u[IU]/mL (ref 0.450–4.500)

## 2024-04-13 LAB — LIPID PANEL
Chol/HDL Ratio: 2.9 ratio (ref 0.0–4.4)
Cholesterol, Total: 156 mg/dL (ref 100–199)
HDL: 53 mg/dL (ref >39)
LDL Chol Calc (NIH): 88 mg/dL (ref 0–99)
Triglycerides: 77 mg/dL (ref 0–149)
VLDL Cholesterol Cal: 15 mg/dL (ref 5–40)

## 2024-04-13 LAB — HEMOGLOBIN A1C
Est. average glucose Bld gHb Est-mCnc: 105 mg/dL
Hgb A1c MFr Bld: 5.3 % (ref 4.8–5.6)

## 2024-04-13 LAB — VITAMIN D 25 HYDROXY (VIT D DEFICIENCY, FRACTURES): Vit D, 25-Hydroxy: 44.3 ng/mL (ref 30.0–100.0)

## 2024-04-16 ENCOUNTER — Emergency Department (HOSPITAL_COMMUNITY)

## 2024-04-16 ENCOUNTER — Encounter (HOSPITAL_COMMUNITY): Payer: Self-pay | Admitting: Emergency Medicine

## 2024-04-16 ENCOUNTER — Emergency Department (HOSPITAL_COMMUNITY)
Admission: EM | Admit: 2024-04-16 | Discharge: 2024-04-16 | Disposition: A | Discharge status: Home / Self Care | Attending: Emergency Medicine | Admitting: Emergency Medicine

## 2024-04-16 ENCOUNTER — Other Ambulatory Visit: Payer: Self-pay

## 2024-04-16 DIAGNOSIS — S161XXA Strain of muscle, fascia and tendon at neck level, initial encounter: Secondary | ICD-10-CM | POA: Diagnosis not present

## 2024-04-16 DIAGNOSIS — Y92410 Unspecified street and highway as the place of occurrence of the external cause: Secondary | ICD-10-CM | POA: Insufficient documentation

## 2024-04-16 DIAGNOSIS — I878 Other specified disorders of veins: Secondary | ICD-10-CM | POA: Diagnosis not present

## 2024-04-16 DIAGNOSIS — M25552 Pain in left hip: Secondary | ICD-10-CM | POA: Insufficient documentation

## 2024-04-16 DIAGNOSIS — S76012A Strain of muscle, fascia and tendon of left hip, initial encounter: Secondary | ICD-10-CM | POA: Diagnosis not present

## 2024-04-16 DIAGNOSIS — S199XXA Unspecified injury of neck, initial encounter: Secondary | ICD-10-CM | POA: Diagnosis not present

## 2024-04-16 DIAGNOSIS — M542 Cervicalgia: Secondary | ICD-10-CM | POA: Diagnosis present

## 2024-04-16 LAB — URINALYSIS, ROUTINE W REFLEX MICROSCOPIC
Bilirubin Urine: NEGATIVE
Glucose, UA: NEGATIVE mg/dL
Hgb urine dipstick: NEGATIVE
Ketones, ur: NEGATIVE mg/dL
Leukocytes,Ua: NEGATIVE
Nitrite: NEGATIVE
Protein, ur: NEGATIVE mg/dL
Specific Gravity, Urine: 1.013 (ref 1.005–1.030)
pH: 6 (ref 5.0–8.0)

## 2024-04-16 LAB — PREGNANCY, URINE: Preg Test, Ur: NEGATIVE

## 2024-04-16 MED ORDER — ACETAMINOPHEN 325 MG PO TABS: 650.0000 mg | ORAL_TABLET | Freq: Four times a day (QID) | ORAL | 0 refills | Status: AC | PRN

## 2024-04-16 MED ORDER — IBUPROFEN 600 MG PO TABS: 600.0000 mg | ORAL_TABLET | Freq: Four times a day (QID) | ORAL | 0 refills | Status: AC | PRN

## 2024-04-16 MED ORDER — CYCLOBENZAPRINE HCL 10 MG PO TABS: 10.0000 mg | ORAL_TABLET | Freq: Two times a day (BID) | ORAL | 0 refills | Status: AC | PRN
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
## 2024-04-23 ENCOUNTER — Other Ambulatory Visit: Payer: Self-pay | Admitting: Family Medicine

## 2024-04-23 DIAGNOSIS — D508 Other iron deficiency anemias: Secondary | ICD-10-CM

## 2024-04-28 DIAGNOSIS — N390 Urinary tract infection, site not specified: Secondary | ICD-10-CM | POA: Diagnosis not present

## 2024-05-08 ENCOUNTER — Ambulatory Visit: Admitting: Physician Assistant

## 2024-05-15 ENCOUNTER — Ambulatory Visit: Admitting: Physician Assistant

## 2024-05-16 ENCOUNTER — Encounter: Payer: Self-pay | Admitting: Physician Assistant

## 2024-05-16 ENCOUNTER — Other Ambulatory Visit: Payer: Self-pay

## 2024-05-16 ENCOUNTER — Ambulatory Visit: Admitting: Physician Assistant

## 2024-05-16 DIAGNOSIS — M25552 Pain in left hip: Secondary | ICD-10-CM

## 2024-05-16 DIAGNOSIS — M5489 Other dorsalgia: Secondary | ICD-10-CM

## 2024-05-16 MED ORDER — PREDNISONE 10 MG PO TABS: 10.0000 mg | ORAL_TABLET | Freq: Every day | ORAL | 0 refills | Status: AC
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
## 2024-05-21 ENCOUNTER — Ambulatory Visit: Payer: Self-pay | Admitting: Family Medicine

## 2024-05-28 ENCOUNTER — Other Ambulatory Visit: Payer: Self-pay | Admitting: Family Medicine

## 2024-05-28 DIAGNOSIS — E559 Vitamin D deficiency, unspecified: Secondary | ICD-10-CM

## 2024-06-02 ENCOUNTER — Other Ambulatory Visit: Payer: Self-pay | Admitting: Family Medicine

## 2024-06-02 DIAGNOSIS — E559 Vitamin D deficiency, unspecified: Secondary | ICD-10-CM

## 2024-08-04 ENCOUNTER — Ambulatory Visit: Admitting: Family Medicine

## 2025-03-27 ENCOUNTER — Ambulatory Visit: Admitting: Dermatology
